# Patient Record
Sex: Male | Born: 1960 | Race: White | Hispanic: No | Marital: Married | Smoking: Current some day smoker
Health system: Southern US, Community
[De-identification: ages and names within clinical notes are randomized; demographics above are authoritative.]

## PROBLEM LIST (undated history)

## (undated) DIAGNOSIS — R918 Other nonspecific abnormal finding of lung field: Secondary | ICD-10-CM

## (undated) DIAGNOSIS — K661 Hemoperitoneum: Secondary | ICD-10-CM

## (undated) DIAGNOSIS — Z720 Tobacco use: Secondary | ICD-10-CM

## (undated) DIAGNOSIS — I10 Essential (primary) hypertension: Secondary | ICD-10-CM

## (undated) DIAGNOSIS — G8929 Other chronic pain: Secondary | ICD-10-CM

## (undated) DIAGNOSIS — I2510 Atherosclerotic heart disease of native coronary artery without angina pectoris: Secondary | ICD-10-CM

## (undated) DIAGNOSIS — I82409 Acute embolism and thrombosis of unspecified deep veins of unspecified lower extremity: Secondary | ICD-10-CM

## (undated) DIAGNOSIS — S36113A Laceration of liver, unspecified degree, initial encounter: Secondary | ICD-10-CM

## (undated) DIAGNOSIS — E291 Testicular hypofunction: Secondary | ICD-10-CM

## (undated) DIAGNOSIS — E785 Hyperlipidemia, unspecified: Secondary | ICD-10-CM

## (undated) DIAGNOSIS — S36039A Unspecified laceration of spleen, initial encounter: Secondary | ICD-10-CM

## (undated) DIAGNOSIS — I2699 Other pulmonary embolism without acute cor pulmonale: Secondary | ICD-10-CM

## (undated) DIAGNOSIS — S2239XA Fracture of one rib, unspecified side, initial encounter for closed fracture: Secondary | ICD-10-CM

## (undated) HISTORY — DX: Other pulmonary embolism without acute cor pulmonale: I26.99

## (undated) HISTORY — PX: BACK SURGERY: SHX140

## (undated) HISTORY — DX: Hyperlipidemia, unspecified: E78.5

## (undated) HISTORY — DX: Acute embolism and thrombosis of unspecified deep veins of unspecified lower extremity: I82.409

## (undated) HISTORY — PX: OTHER SURGICAL HISTORY: SHX169

## (undated) HISTORY — DX: Unspecified laceration of spleen, initial encounter: S36.039A

## (undated) HISTORY — DX: Essential (primary) hypertension: I10

## (undated) HISTORY — DX: Tobacco use: Z72.0

## (undated) HISTORY — DX: Laceration of liver, unspecified degree, initial encounter: S36.113A

## (undated) HISTORY — DX: Other nonspecific abnormal finding of lung field: R91.8

## (undated) HISTORY — DX: Testicular hypofunction: E29.1

## (undated) HISTORY — DX: Other chronic pain: G89.29

## (undated) HISTORY — PX: APPENDECTOMY: SHX54

## (undated) HISTORY — DX: Atherosclerotic heart disease of native coronary artery without angina pectoris: I25.10

## (undated) HISTORY — DX: Fracture of one rib, unspecified side, initial encounter for closed fracture: S22.39XA

## (undated) HISTORY — DX: Hemoperitoneum: K66.1

---

## 2001-05-03 ENCOUNTER — Emergency Department (HOSPITAL_COMMUNITY): Admission: EM | Admit: 2001-05-03 | Discharge: 2001-05-03 | Payer: Self-pay | Admitting: Emergency Medicine

## 2001-05-03 ENCOUNTER — Encounter: Payer: Self-pay | Admitting: Emergency Medicine

## 2001-11-22 ENCOUNTER — Encounter: Payer: Self-pay | Admitting: Family Medicine

## 2001-11-22 ENCOUNTER — Ambulatory Visit (HOSPITAL_COMMUNITY): Admission: RE | Admit: 2001-11-22 | Discharge: 2001-11-22 | Payer: Self-pay | Admitting: Family Medicine

## 2002-11-30 ENCOUNTER — Emergency Department (HOSPITAL_COMMUNITY): Admission: EM | Admit: 2002-11-30 | Discharge: 2002-12-01 | Payer: Self-pay | Admitting: *Deleted

## 2002-12-01 ENCOUNTER — Encounter: Payer: Self-pay | Admitting: *Deleted

## 2005-03-27 ENCOUNTER — Ambulatory Visit: Payer: Self-pay | Admitting: Occupational Therapy

## 2005-04-28 ENCOUNTER — Ambulatory Visit: Payer: Self-pay | Admitting: Internal Medicine

## 2007-04-08 DIAGNOSIS — I2510 Atherosclerotic heart disease of native coronary artery without angina pectoris: Secondary | ICD-10-CM

## 2007-04-08 HISTORY — PX: CARDIAC CATHETERIZATION: SHX172

## 2007-04-08 HISTORY — DX: Atherosclerotic heart disease of native coronary artery without angina pectoris: I25.10

## 2007-05-09 ENCOUNTER — Ambulatory Visit: Payer: Self-pay | Admitting: Cardiology

## 2007-05-09 ENCOUNTER — Inpatient Hospital Stay (HOSPITAL_COMMUNITY): Admission: EM | Admit: 2007-05-09 | Discharge: 2007-05-11 | Payer: Self-pay | Admitting: Emergency Medicine

## 2007-05-09 HISTORY — PX: CORONARY STENT PLACEMENT: SHX1402

## 2007-05-31 ENCOUNTER — Ambulatory Visit: Payer: Self-pay | Admitting: Cardiology

## 2007-10-27 ENCOUNTER — Ambulatory Visit: Payer: Self-pay

## 2007-10-27 ENCOUNTER — Ambulatory Visit: Payer: Self-pay | Admitting: Cardiology

## 2008-05-08 DIAGNOSIS — S36039A Unspecified laceration of spleen, initial encounter: Secondary | ICD-10-CM

## 2008-05-08 DIAGNOSIS — I2699 Other pulmonary embolism without acute cor pulmonale: Secondary | ICD-10-CM

## 2008-05-08 DIAGNOSIS — I82409 Acute embolism and thrombosis of unspecified deep veins of unspecified lower extremity: Secondary | ICD-10-CM

## 2008-05-08 DIAGNOSIS — S2249XA Multiple fractures of ribs, unspecified side, initial encounter for closed fracture: Secondary | ICD-10-CM

## 2008-05-08 DIAGNOSIS — S36113A Laceration of liver, unspecified degree, initial encounter: Secondary | ICD-10-CM

## 2008-05-08 HISTORY — DX: Multiple fractures of ribs, unspecified side, initial encounter for closed fracture: S22.49XA

## 2008-05-08 HISTORY — DX: Laceration of liver, unspecified degree, initial encounter: S36.113A

## 2008-05-08 HISTORY — DX: Unspecified laceration of spleen, initial encounter: S36.039A

## 2008-05-08 HISTORY — PX: THORACENTESIS: SHX235

## 2008-05-08 HISTORY — PX: FRACTURE SURGERY: SHX138

## 2008-05-08 HISTORY — DX: Other pulmonary embolism without acute cor pulmonale: I26.99

## 2008-05-08 HISTORY — DX: Acute embolism and thrombosis of unspecified deep veins of unspecified lower extremity: I82.409

## 2008-06-07 ENCOUNTER — Ambulatory Visit: Payer: Self-pay | Admitting: Physical Medicine & Rehabilitation

## 2008-06-07 ENCOUNTER — Inpatient Hospital Stay (HOSPITAL_COMMUNITY)
Admission: RE | Admit: 2008-06-07 | Discharge: 2008-06-15 | Payer: Self-pay | Admitting: Physical Medicine & Rehabilitation

## 2008-06-07 ENCOUNTER — Encounter: Payer: Self-pay | Admitting: Family Medicine

## 2008-06-22 ENCOUNTER — Ambulatory Visit: Payer: Self-pay | Admitting: Vascular Surgery

## 2008-06-22 ENCOUNTER — Inpatient Hospital Stay (HOSPITAL_COMMUNITY): Admission: EM | Admit: 2008-06-22 | Discharge: 2008-06-25 | Payer: Self-pay | Admitting: Emergency Medicine

## 2008-06-22 ENCOUNTER — Encounter (INDEPENDENT_AMBULATORY_CARE_PROVIDER_SITE_OTHER): Payer: Self-pay | Admitting: Emergency Medicine

## 2008-07-04 ENCOUNTER — Ambulatory Visit: Payer: Self-pay | Admitting: Family Medicine

## 2008-07-04 DIAGNOSIS — I82409 Acute embolism and thrombosis of unspecified deep veins of unspecified lower extremity: Secondary | ICD-10-CM | POA: Insufficient documentation

## 2008-07-04 DIAGNOSIS — I10 Essential (primary) hypertension: Secondary | ICD-10-CM

## 2008-07-04 DIAGNOSIS — Z86711 Personal history of pulmonary embolism: Secondary | ICD-10-CM

## 2008-07-04 DIAGNOSIS — G4700 Insomnia, unspecified: Secondary | ICD-10-CM

## 2008-07-09 ENCOUNTER — Ambulatory Visit: Payer: Self-pay | Admitting: Cardiovascular Disease

## 2008-07-09 LAB — CONVERTED CEMR LAB
INR: 1.5 — ABNORMAL HIGH (ref 0.8–1.0)
Prothrombin Time: 17.5 s — ABNORMAL HIGH (ref 10.9–13.3)

## 2008-07-11 ENCOUNTER — Telehealth: Payer: Self-pay | Admitting: Family Medicine

## 2008-07-12 ENCOUNTER — Ambulatory Visit: Payer: Self-pay | Admitting: Cardiovascular Disease

## 2008-07-12 LAB — CONVERTED CEMR LAB
INR: 1.7 — ABNORMAL HIGH (ref 0.8–1.0)
Prothrombin Time: 19.3 s — ABNORMAL HIGH (ref 10.9–13.3)

## 2008-07-16 ENCOUNTER — Encounter: Payer: Self-pay | Admitting: Family Medicine

## 2008-07-18 ENCOUNTER — Ambulatory Visit: Payer: Self-pay | Admitting: Family Medicine

## 2008-07-19 LAB — CONVERTED CEMR LAB
ALT: 27 units/L (ref 0–53)
AST: 20 units/L (ref 0–37)
Albumin: 4.1 g/dL (ref 3.5–5.2)
Alkaline Phosphatase: 79 units/L (ref 39–117)
BUN: 10 mg/dL (ref 6–23)
Bilirubin, Direct: 0.1 mg/dL (ref 0.0–0.3)
CO2: 31 meq/L (ref 19–32)
Calcium: 9.3 mg/dL (ref 8.4–10.5)
Chloride: 101 meq/L (ref 96–112)
Creatinine, Ser: 1.3 mg/dL (ref 0.4–1.5)
GFR calc Af Amer: 76 mL/min
GFR calc non Af Amer: 63 mL/min
Glucose, Bld: 81 mg/dL (ref 70–99)
Potassium: 4.2 meq/L (ref 3.5–5.1)
Sodium: 138 meq/L (ref 135–145)
Total Bilirubin: 0.5 mg/dL (ref 0.3–1.2)
Total Protein: 7.1 g/dL (ref 6.0–8.3)

## 2008-07-20 ENCOUNTER — Encounter: Payer: Self-pay | Admitting: Family Medicine

## 2008-07-24 ENCOUNTER — Telehealth: Payer: Self-pay | Admitting: Family Medicine

## 2008-07-24 ENCOUNTER — Encounter: Payer: Self-pay | Admitting: Family Medicine

## 2008-07-26 ENCOUNTER — Encounter: Payer: Self-pay | Admitting: Family Medicine

## 2008-08-03 ENCOUNTER — Ambulatory Visit: Payer: Self-pay | Admitting: Family Medicine

## 2008-08-03 DIAGNOSIS — M542 Cervicalgia: Secondary | ICD-10-CM | POA: Insufficient documentation

## 2008-08-07 ENCOUNTER — Encounter: Payer: Self-pay | Admitting: Family Medicine

## 2008-08-09 ENCOUNTER — Encounter: Payer: Self-pay | Admitting: Family Medicine

## 2008-08-15 ENCOUNTER — Telehealth: Payer: Self-pay | Admitting: Family Medicine

## 2008-08-16 ENCOUNTER — Encounter: Payer: Self-pay | Admitting: Family Medicine

## 2008-08-17 ENCOUNTER — Telehealth: Payer: Self-pay | Admitting: Family Medicine

## 2008-08-17 ENCOUNTER — Telehealth (INDEPENDENT_AMBULATORY_CARE_PROVIDER_SITE_OTHER): Payer: Self-pay | Admitting: *Deleted

## 2008-08-20 ENCOUNTER — Encounter: Payer: Self-pay | Admitting: Family Medicine

## 2008-08-22 ENCOUNTER — Encounter: Payer: Self-pay | Admitting: Family Medicine

## 2008-08-29 ENCOUNTER — Ambulatory Visit: Payer: Self-pay | Admitting: Family Medicine

## 2008-08-29 DIAGNOSIS — F17200 Nicotine dependence, unspecified, uncomplicated: Secondary | ICD-10-CM | POA: Insufficient documentation

## 2008-10-09 ENCOUNTER — Ambulatory Visit: Payer: Self-pay | Admitting: Family Medicine

## 2008-10-11 ENCOUNTER — Ambulatory Visit: Payer: Self-pay | Admitting: Cardiovascular Disease

## 2008-10-11 LAB — CONVERTED CEMR LAB
ALT: 22 units/L (ref 0–53)
AST: 15 units/L (ref 0–37)
Albumin: 4.4 g/dL (ref 3.5–5.2)
Alkaline Phosphatase: 65 units/L (ref 39–117)
Bilirubin, Direct: 0.1 mg/dL (ref 0.0–0.3)
Cholesterol: 216 mg/dL (ref 0–200)
Direct LDL: 125 mg/dL
HDL: 21.1 mg/dL — ABNORMAL LOW (ref >39.0)
INR: 2.1 — ABNORMAL HIGH (ref 0.8–1.0)
Prothrombin Time: 21.5 s — ABNORMAL HIGH (ref 10.9–13.3)
Total Bilirubin: 0.7 mg/dL (ref 0.3–1.2)
Total CHOL/HDL Ratio: 10.2
Total Protein: 7.4 g/dL (ref 6.0–8.3)
Triglycerides: 327 mg/dL (ref 0–149)
VLDL: 65 mg/dL — ABNORMAL HIGH (ref 0–40)

## 2008-10-16 ENCOUNTER — Telehealth: Payer: Self-pay | Admitting: Family Medicine

## 2008-10-22 ENCOUNTER — Encounter: Payer: Self-pay | Admitting: Family Medicine

## 2008-11-05 ENCOUNTER — Ambulatory Visit: Payer: Self-pay | Admitting: Cardiology

## 2008-11-26 ENCOUNTER — Telehealth: Payer: Self-pay | Admitting: Family Medicine

## 2008-11-28 ENCOUNTER — Ambulatory Visit: Payer: Self-pay | Admitting: Family Medicine

## 2008-12-03 ENCOUNTER — Ambulatory Visit: Payer: Self-pay | Admitting: Family Medicine

## 2008-12-03 DIAGNOSIS — E785 Hyperlipidemia, unspecified: Secondary | ICD-10-CM

## 2008-12-05 ENCOUNTER — Telehealth: Payer: Self-pay | Admitting: Family Medicine

## 2008-12-13 ENCOUNTER — Ambulatory Visit: Payer: Self-pay | Admitting: Internal Medicine

## 2008-12-19 ENCOUNTER — Telehealth: Payer: Self-pay | Admitting: Family Medicine

## 2008-12-26 ENCOUNTER — Encounter: Payer: Self-pay | Admitting: Cardiovascular Disease

## 2008-12-26 ENCOUNTER — Ambulatory Visit: Payer: Self-pay | Admitting: Cardiovascular Disease

## 2008-12-27 ENCOUNTER — Ambulatory Visit: Payer: Self-pay

## 2009-01-01 ENCOUNTER — Ambulatory Visit: Payer: Self-pay | Admitting: Cardiology

## 2009-01-05 DIAGNOSIS — K683 Retroperitoneal hematoma: Secondary | ICD-10-CM

## 2009-01-05 DIAGNOSIS — K661 Hemoperitoneum: Secondary | ICD-10-CM

## 2009-01-05 HISTORY — PX: CERVICAL SPINE SURGERY: SHX589

## 2009-01-05 HISTORY — DX: Hemoperitoneum: K66.1

## 2009-01-05 HISTORY — DX: Retroperitoneal hematoma: K68.3

## 2009-01-08 ENCOUNTER — Ambulatory Visit: Payer: Self-pay | Admitting: Internal Medicine

## 2009-01-08 ENCOUNTER — Ambulatory Visit: Payer: Self-pay | Admitting: Cardiovascular Disease

## 2009-01-10 ENCOUNTER — Telehealth: Payer: Self-pay | Admitting: Cardiovascular Disease

## 2009-01-17 ENCOUNTER — Telehealth: Payer: Self-pay | Admitting: Family Medicine

## 2009-01-21 ENCOUNTER — Encounter: Payer: Self-pay | Admitting: Family Medicine

## 2009-01-21 ENCOUNTER — Emergency Department (HOSPITAL_COMMUNITY): Admission: EM | Admit: 2009-01-21 | Discharge: 2009-01-21 | Payer: Self-pay | Admitting: Emergency Medicine

## 2009-01-22 ENCOUNTER — Encounter: Payer: Self-pay | Admitting: Family Medicine

## 2009-01-24 ENCOUNTER — Encounter: Payer: Self-pay | Admitting: Family Medicine

## 2009-01-25 ENCOUNTER — Telehealth (INDEPENDENT_AMBULATORY_CARE_PROVIDER_SITE_OTHER): Payer: Self-pay | Admitting: Cardiology

## 2009-01-25 ENCOUNTER — Telehealth: Payer: Self-pay | Admitting: Family Medicine

## 2009-01-28 ENCOUNTER — Ambulatory Visit: Payer: Self-pay | Admitting: Family Medicine

## 2009-01-29 LAB — CONVERTED CEMR LAB
Basophils Absolute: 0 10*3/uL (ref 0.0–0.1)
Eosinophils Relative: 2.6 % (ref 0.0–5.0)
HCT: 37.5 % — ABNORMAL LOW (ref 39.0–52.0)
Lymphs Abs: 3.4 10*3/uL (ref 0.7–4.0)
MCV: 95.6 fL (ref 78.0–100.0)
Monocytes Absolute: 0.2 10*3/uL (ref 0.1–1.0)
Platelets: 605 10*3/uL — ABNORMAL HIGH (ref 150.0–400.0)
Prothrombin Time: 12.1 s (ref 10.9–13.3)
RDW: 11.4 % — ABNORMAL LOW (ref 11.5–14.6)

## 2009-02-01 ENCOUNTER — Encounter: Payer: Self-pay | Admitting: Family Medicine

## 2009-02-01 ENCOUNTER — Telehealth: Payer: Self-pay | Admitting: Family Medicine

## 2009-02-05 ENCOUNTER — Encounter: Payer: Self-pay | Admitting: *Deleted

## 2009-02-12 ENCOUNTER — Ambulatory Visit: Payer: Self-pay | Admitting: Family Medicine

## 2009-02-20 LAB — CONVERTED CEMR LAB
Anticardiolipin IgG: <7 (ref <11)
Protein S Ag, Total: 109 % (ref 70–140)

## 2009-02-28 ENCOUNTER — Encounter: Payer: Self-pay | Admitting: Cardiovascular Disease

## 2009-02-28 ENCOUNTER — Ambulatory Visit: Payer: Self-pay

## 2009-02-28 ENCOUNTER — Telehealth: Payer: Self-pay | Admitting: Cardiovascular Disease

## 2009-03-13 ENCOUNTER — Encounter: Payer: Self-pay | Admitting: *Deleted

## 2009-04-16 ENCOUNTER — Ambulatory Visit: Payer: Self-pay | Admitting: Family Medicine

## 2009-04-17 LAB — CONVERTED CEMR LAB
ALT: 24 units/L (ref 0–53)
AST: 19 units/L (ref 0–37)
Alkaline Phosphatase: 61 units/L (ref 39–117)
Bilirubin, Direct: 0 mg/dL (ref 0.0–0.3)
Eosinophils Relative: 1.7 % (ref 0.0–5.0)
HCT: 45.8 % (ref 39.0–52.0)
Lymphs Abs: 2.9 10*3/uL (ref 0.7–4.0)
Monocytes Relative: 6.4 % (ref 3.0–12.0)
Platelets: 379 10*3/uL (ref 150.0–400.0)
Total Bilirubin: 1 mg/dL (ref 0.3–1.2)
WBC: 8.8 10*3/uL (ref 4.5–10.5)

## 2009-04-18 ENCOUNTER — Encounter: Payer: Self-pay | Admitting: Cardiology

## 2009-05-22 ENCOUNTER — Ambulatory Visit: Payer: Self-pay | Admitting: Family Medicine

## 2009-05-22 DIAGNOSIS — R413 Other amnesia: Secondary | ICD-10-CM

## 2009-05-22 DIAGNOSIS — F411 Generalized anxiety disorder: Secondary | ICD-10-CM

## 2009-05-22 LAB — CONVERTED CEMR LAB: Creatinine, Ser: 0.9 mg/dL (ref 0.4–1.5)

## 2009-05-27 ENCOUNTER — Telehealth (INDEPENDENT_AMBULATORY_CARE_PROVIDER_SITE_OTHER): Payer: Self-pay | Admitting: *Deleted

## 2009-06-10 ENCOUNTER — Telehealth: Payer: Self-pay | Admitting: Family Medicine

## 2009-06-11 ENCOUNTER — Telehealth (INDEPENDENT_AMBULATORY_CARE_PROVIDER_SITE_OTHER): Payer: Self-pay | Admitting: *Deleted

## 2009-06-24 ENCOUNTER — Ambulatory Visit: Payer: Self-pay | Admitting: Family Medicine

## 2009-07-02 ENCOUNTER — Telehealth: Payer: Self-pay | Admitting: Family Medicine

## 2009-07-04 ENCOUNTER — Ambulatory Visit: Payer: Self-pay | Admitting: Family Medicine

## 2009-07-05 ENCOUNTER — Telehealth: Payer: Self-pay | Admitting: Family Medicine

## 2009-08-20 ENCOUNTER — Telehealth (INDEPENDENT_AMBULATORY_CARE_PROVIDER_SITE_OTHER): Payer: Self-pay | Admitting: *Deleted

## 2009-10-15 ENCOUNTER — Encounter (INDEPENDENT_AMBULATORY_CARE_PROVIDER_SITE_OTHER): Payer: Self-pay | Admitting: *Deleted

## 2009-11-14 ENCOUNTER — Encounter: Payer: Self-pay | Admitting: Family Medicine

## 2009-11-15 ENCOUNTER — Ambulatory Visit: Payer: Self-pay | Admitting: Family Medicine

## 2009-11-15 DIAGNOSIS — R5383 Other fatigue: Secondary | ICD-10-CM

## 2009-11-15 DIAGNOSIS — N529 Male erectile dysfunction, unspecified: Secondary | ICD-10-CM

## 2009-11-15 DIAGNOSIS — M1612 Unilateral primary osteoarthritis, left hip: Secondary | ICD-10-CM | POA: Insufficient documentation

## 2009-11-15 DIAGNOSIS — R5381 Other malaise: Secondary | ICD-10-CM | POA: Insufficient documentation

## 2009-11-18 LAB — CONVERTED CEMR LAB
Direct LDL: 124.9 mg/dL
Testosterone: 157.98 ng/dL — ABNORMAL LOW (ref 350.00–890.00)

## 2009-11-22 ENCOUNTER — Telehealth: Payer: Self-pay | Admitting: Family Medicine

## 2009-11-26 ENCOUNTER — Telehealth (INDEPENDENT_AMBULATORY_CARE_PROVIDER_SITE_OTHER): Payer: Self-pay | Admitting: *Deleted

## 2009-12-19 ENCOUNTER — Telehealth: Payer: Self-pay | Admitting: Family Medicine

## 2009-12-19 ENCOUNTER — Telehealth (INDEPENDENT_AMBULATORY_CARE_PROVIDER_SITE_OTHER): Payer: Self-pay | Admitting: *Deleted

## 2009-12-19 ENCOUNTER — Ambulatory Visit: Payer: Self-pay | Admitting: Family Medicine

## 2009-12-19 DIAGNOSIS — F1910 Other psychoactive substance abuse, uncomplicated: Secondary | ICD-10-CM | POA: Insufficient documentation

## 2009-12-20 ENCOUNTER — Telehealth: Payer: Self-pay | Admitting: Family Medicine

## 2010-01-03 ENCOUNTER — Telehealth: Payer: Self-pay | Admitting: Cardiovascular Disease

## 2010-01-03 ENCOUNTER — Telehealth: Payer: Self-pay | Admitting: Family Medicine

## 2010-01-03 ENCOUNTER — Emergency Department (HOSPITAL_COMMUNITY): Admission: EM | Admit: 2010-01-03 | Discharge: 2010-01-04 | Payer: Self-pay | Admitting: Emergency Medicine

## 2010-01-13 ENCOUNTER — Emergency Department: Payer: Self-pay | Admitting: Emergency Medicine

## 2010-02-07 ENCOUNTER — Telehealth: Payer: Self-pay | Admitting: Cardiovascular Disease

## 2010-03-04 ENCOUNTER — Ambulatory Visit: Payer: Self-pay | Admitting: Cardiovascular Disease

## 2010-04-25 ENCOUNTER — Telehealth (INDEPENDENT_AMBULATORY_CARE_PROVIDER_SITE_OTHER): Payer: Self-pay | Admitting: *Deleted

## 2010-05-15 ENCOUNTER — Telehealth (INDEPENDENT_AMBULATORY_CARE_PROVIDER_SITE_OTHER): Payer: Self-pay | Admitting: *Deleted

## 2010-05-29 ENCOUNTER — Telehealth (INDEPENDENT_AMBULATORY_CARE_PROVIDER_SITE_OTHER): Payer: Self-pay | Admitting: *Deleted

## 2010-07-08 ENCOUNTER — Telehealth: Payer: Self-pay | Admitting: Cardiovascular Disease

## 2010-07-11 ENCOUNTER — Encounter: Payer: Self-pay | Admitting: Cardiovascular Disease

## 2010-07-11 ENCOUNTER — Ambulatory Visit: Payer: Self-pay | Admitting: Cardiovascular Disease

## 2010-07-22 ENCOUNTER — Ambulatory Visit: Payer: Self-pay | Admitting: Cardiovascular Disease

## 2010-07-28 ENCOUNTER — Telehealth: Payer: Self-pay | Admitting: Cardiovascular Disease

## 2010-09-28 ENCOUNTER — Encounter: Payer: Self-pay | Admitting: Family Medicine

## 2010-09-30 ENCOUNTER — Encounter: Payer: Self-pay | Admitting: Family Medicine
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
## 2010-10-09 NOTE — Progress Notes (Signed)
Summary: pt needs refill  Phone Note Refill Request Call back at Home Phone [phone] Message from:  Patient on [phone]/[facility] in [place]  Refills Requested: Medication #1:  METOPROLOL TARTRATE 25 MG TABS 1 by mouth two times a day Initial call taken by: Omer Jack,  February 07, 2010 12:56 PM  Follow-up for Phone Call        Rx faxed to pharmacy Follow-up by: [name],  February 10, 2010 10:10 AM    Prescriptions: METOPROLOL TARTRATE 25 MG TABS (METOPROLOL TARTRATE) 1 by mouth two times a day  #60 x 2   Entered by:   [name]   Authorized by:   Norva Karvonen, MD   Signed by:   [name] on 02/10/2010   Method used:   Faxed to ...       [facility] (retail)       [address]       [place], [place]  [address]       Ph: [phone]       Fax: [phone]   RxID:   [id number]

## 2010-10-09 NOTE — Progress Notes (Signed)
Summary: androgel  Phone Note Call from Patient Call back at Home Phone [phone]   Caller: Patient Call For: Hannah Beat MD Summary of Call: Patient called to let you know that the number to call for the PA for the androgel. [phone] Initial call taken by: Melody Comas,  November 26, 2009 4:52 PM  Follow-up for Phone Call        please assist Follow-up by: Hannah Beat MD,  November 27, 2009 8:05 AM  Additional Follow-up for Phone Call Additional follow up Details #1::        Advised pt that he wil need to get his pharmacy to fax [place] a prior auth form so that we will have the information we need to call his insurance company.  Pt agreed to call. Additional Follow-up by: Lowella Petties CMA,  November 27, 2009 9:18 AM

## 2010-10-09 NOTE — Progress Notes (Signed)
  request recieved from [facility] sent to [facility]  May 29, 2010 9:10 AM     Appended Document:  Recieved request back from [facility] sent to [facility]
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
## 2010-10-09 NOTE — Progress Notes (Signed)
Summary: wife is requesting that you call her  Phone Note Call from Patient Call back at [phone]   Caller: Spouse Angie Summary of Call: Pt's wife is asking that you call her so that she can explain some things to you.  She is asking for advise on how to get pt some help.   Initial call taken by: Lowella Petties CMA,  January 03, 2010 8:22 AM  Follow-up for Phone Call        20 minute conversation, patient is safe in the [facility] ER right now. Discussed his drug problems with his wife yesterday, and now they are seeking help there. Discussed that the behavioral health center was most reasonable for evaluation and continuing care. Additionally, suggested that [facility] is one of the best facilities in the country, fairly near where they live. Additionally, recommended narcotics anonymous and [place] for her.  Follow-up by: Hannah Beat MD,  January 03, 2010 12:19 PM

## 2010-10-09 NOTE — Letter (Signed)
Summary: [place] No Show Letter  [place] at [facility]  [address] [place], [place] [address]   Phone: [phone]  Fax: [phone]    10/15/2009 MRN: [id number]  Farmer [place] [address] [place], [place]  [address]   Dear Mr. BARLEY,   Our records indicate that you missed your scheduled appointment with __lab___________________ on ___2.8.11_________.  Please contact this office to reschedule your appointment as soon as possible.  It is important that you keep your scheduled appointments with your physician, so we can provide you the best care possible.  Please be advised that there may be a charge for "no show" appointments.    Sincerely,   [place] at [facility]
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
## 2010-10-09 NOTE — Progress Notes (Signed)
Summary: medication questions  Phone Note Call from Patient Call back at Home Phone [phone] Call back at [phone]   Caller: Patient/Janet Reason for Call: Talk to Nurse, Talk to Doctor Summary of Call: pt's neuro psychologist wants to put pt on ritalin 50mg  and needs an EKG to help with patients attention span and they want to discuss this Initial call taken by: Omer Jack,  July 08, 2010 1:56 PM  Follow-up for Phone Call        Will attempt to reach pt on 07/09/10.  Voicemail does not have correct name. Julieta Gutting, RN, BSN  July 08, 2010 6:31 PM  pt's wife returning call -pls call [phone] or [phone] Glynda Jaeger  July 09, 2010 10:33 AM  I spoke with the pt's wife and she said the pt's neurologist started him on Ritalin about a month ago.  The pt was suppose to follow-up with Dr Excell Seltzer to make sure this was okay from a cardiac standpoint. The neurologist would like the pt to have an EKG.  I arranged an appt for the pt to see Dr Excell Seltzer on 07/11/10.    Follow-up by: Julieta Gutting, RN, BSN,  July 09, 2010 11:20 AM
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
## 2010-10-09 NOTE — Progress Notes (Signed)
Summary: regarding visit  Phone Note Call from Patient   Caller: Patient Call For: Hannah Beat MD Summary of Call: Pt called and said he would find himself another doctor, that's all he would tell me. Initial call taken by: Lowella Petties CMA,  December 19, 2009 3:02 PM  Follow-up for Phone Call        Noted, and see all notes involved with this case. Follow-up by: Hannah Beat MD,  December 19, 2009 5:02 PM

## 2010-10-09 NOTE — Progress Notes (Signed)
  Phone Note Call from Patient Call back at [facility] Phone [phone]   Caller: Patient Call For: Dr.Latrell Reitan Summary of Call: Pt. called to say he wanted to get his records because he is looking for a new doctor.  He said he appreciated your help,but he was going to find help elsewhere.  He'll be coming by next week to sign a release for his records. Initial call taken by: Beau Fanny,  December 20, 2009 2:34 PM  Follow-up for Phone Call        noted. Follow-up by: Hannah Beat MD,  December 20, 2009 4:07 PM

## 2010-10-09 NOTE — Progress Notes (Signed)
Summary: MD in [facility] is waiting on note / ekg  Phone Note Call from Patient Call back at [facility] Phone [phone]   Caller: Spouse Reason for Call: Talk to Nurse Summary of Call: MD in [place] is waiting on office note & ekg . fax # [phone]. attention Dr. Bartholomew Boards Initial call taken by: Lorne Skeens,  July 28, 2010 10:22 AM  Follow-up for Phone Call        I left a message that records are being faxed. Julieta Gutting, RN, BSN  July 28, 2010 10:35 AM
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
## 2010-10-09 NOTE — Progress Notes (Signed)
Summary: PT REQUESTING CALL DUE THE PT MEDICATIONS  Phone Note Call from Patient Call back at [phone]   Caller: Spouse/ ANGIE Summary of Call: PT WIFE REQUESTING CALL DUE TO MEDICATIONS Initial call taken by: Judie Grieve,  January 03, 2010 9:47 AM  Follow-up for Phone Call        Spoke with pt's wife who is crying on phone and states husband asked her to get help for him today for his addiction to pain medications.  Wife states pt is anxious and not sleeping. No complaints of chest pain. I asked wife to contact primary MD for this but she states he no longer has primary MD. Wife states she was unaware of Dr. Durel Salts offer to provide referrals at last office visit with him. I instructed wife the best course of action would be to take pt to emergency room to be seen by emergency room doctor for evaluation. I told wife that at this time pt would not be seen by Dr. Excell Seltzer in ED as he was not having cardiac issues and would be evaluated by ED physician.  Wife agrees with this plan and states husband is willing to go to ED. Follow-up by: Dossie Arbour, RN, BSN,  January 03, 2010 10:16 AM
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
## 2010-10-09 NOTE — Letter (Signed)
Summary: Discharge Letter  [place] [facility]  [address] [place], [place] [address]   Phone: [phone]  Fax: [phone]       12/19/2009 MRN: [id number]  [name] [address] [place], [place]  [address]  Dear [name],   I find it necessary to inform you that I will not be able to provide medical care to you, because of behavior at the office visit and interactions with our staff on 12/19/2009.  Since your condition requires medical attention, I suggest that you place your self under the care of another physician without delay. If you desire, I will be available for emergency care for 30 days after you receive this letter.  This should give you ample time to select a physician of your choice from the many competent providers in this area. You may want to call the local medical society or [facility]'s physician referral service [phone]) for their assistance in locating a new physician. With your written authorization, I will make a copy of your medical record available to your new physician.   Sincerely,    Dr. Karleen Hampshire Cele Mote    Appended Document: Discharge Letter IDX and EMR updated to reflect dismissal. Letter sent by certified mail.  Appended Document: Discharge Letter Signed receipt returned from [employer] verifying delivery.

## 2010-10-09 NOTE — Progress Notes (Signed)
Summary: Not acting right  Phone Note Call from Patient Call back at [phone]   Caller: Spouse/Angie Call For: Oscar Beat MD Summary of Call: Patient's wife called and says that her husband is so out of sorts.  Thinks his anxiety medication is not working.  Fell yesterday morning and hit his head, she wants him to been seen earlier than his 2:00 appt today.  Advised her that there were no open appt times for earlier.  Please advise. Initial call taken by: Linde Gillis CMA Duncan Dull),  December 19, 2009 8:10 AM  Follow-up for Phone Call        Currently I have 1 patient is acutely ill and decompensating in the office and am 4 patients behind.  I would suggest ER evaluation if hit head and out of sorts most appropriate.  Follow-up by: Oscar Beat MD,  December 19, 2009 8:25 AM  Additional Follow-up for Phone Call Additional follow up Details #1::        Advised patient's wife as instructed.  She says that she is not taking him to the ER for evaluation, he seems to be doing a little better.  Will just wait and bring him in his afternoon at his 2:00 appt. Additional Follow-up by: Linde Gillis CMA Duncan Dull),  December 19, 2009 8:35 AM    Additional Follow-up for Phone Call Additional follow up Details #2::    noted Follow-up by: Oscar Beat MD,  December 19, 2009 9:10 AM

## 2010-10-09 NOTE — Progress Notes (Signed)
Summary: wife requests phone call   Phone Note Call from Patient Call back at [phone]   Caller: Spouse- Angie Summary of Call: Pt was prescribed androgel for low testosterone.  Wife is concerned because a possible side effect is blood clots, and pt has a history of these.  He has not started this yet.  Please advise.  Also, I advised her that pt's brain MRI was essentially normal, she would like to discuss this with you.  Please call if you can.  She knows you are out till monday. Initial call taken by: Lowella Petties CMA,  November 22, 2009 8:48 AM  Follow-up for Phone Call        Attempted call, but no answer.  Hypercoaguable work-up negative. Discussed all findings with Kairee previously for about 10 mins on phone -- willl attempt to call again and discuss with wife if he is agreeable to that discussion. Follow-up by: Hannah Beat MD,  November 23, 2009 3:36 PM  Additional Follow-up for Phone Call Additional follow up Details #1::        Unable to contact patient to get permission to speak to wife, spoke in generalities about low testosterone to wife. More specific patient questions raised, and will attempt to get patient persmission to discuss with her further. Additional Follow-up by: Hannah Beat MD,  November 24, 2009 10:11 AM    Additional Follow-up for Phone Call Additional follow up Details #2::    I tried to explain HPA problems to the best of my ability. Hypogonadism - 20 minute conversation and answered all questions.  Fatigue, depression, erection problems, Test level of 150. Follow-up by: Hannah Beat MD,  November 25, 2009 5:30 PM
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
## 2010-10-23 NOTE — Letter (Signed)
Summary: [facility]  [facility]   Imported By: Kassie Mends 10/15/2010 10:04:16  _____________________________________________________________________  External Attachment:    Type:   Image     Comment:   External Document

## 2010-11-25 LAB — RAPID URINE DRUG SCREEN, HOSP PERFORMED
Amphetamines: NOT DETECTED
Benzodiazepines: POSITIVE — AB
Cocaine: NOT DETECTED
Tetrahydrocannabinol: NOT DETECTED

## 2010-11-25 LAB — HEPATIC FUNCTION PANEL
Albumin: 4.5 g/dL (ref 3.5–5.2)
Bilirubin, Direct: 0.1 mg/dL (ref 0.0–0.3)
Total Bilirubin: 0.7 mg/dL (ref 0.3–1.2)

## 2010-11-25 LAB — POCT I-STAT, CHEM 8
BUN: 17 mg/dL (ref 6–23)
Calcium, Ion: 1.09 mmol/L — ABNORMAL LOW (ref 1.12–1.32)
Chloride: 107 meq/L (ref 96–112)
Creatinine, Ser: 0.6 mg/dL (ref 0.4–1.5)
Glucose, Bld: 128 mg/dL — ABNORMAL HIGH (ref 70–99)
HCT: 50 % (ref 39.0–52.0)
Hemoglobin: 17 g/dL (ref 13.0–17.0)
Potassium: 4 mEq/L (ref 3.5–5.1)
Sodium: 141 mEq/L (ref 135–145)
TCO2: 26 mmol/L (ref 0–100)

## 2010-11-25 LAB — TRICYCLICS SCREEN, URINE: TCA Scrn: NOT DETECTED

## 2010-11-25 LAB — CBC
Hemoglobin: 16.2 g/dL (ref 13.0–17.0)
MCHC: 34.8 g/dL (ref 30.0–36.0)
RDW: 13.2 % (ref 11.5–15.5)

## 2010-11-25 LAB — DIFFERENTIAL
Basophils Absolute: 0 10*3/uL (ref 0.0–0.1)
Basophils Relative: 0 % (ref 0–1)
Monocytes Absolute: 0.7 10*3/uL (ref 0.1–1.0)
Neutro Abs: 10.4 10*3/uL — ABNORMAL HIGH (ref 1.7–7.7)
Neutrophils Relative %: 77 % (ref 43–77)
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
## 2011-01-20 NOTE — Assessment & Plan Note (Signed)
[facility]                            CARDIOLOGY OFFICE NOTE   NAME:[name], [name]                       MRN:          [id number]  DATE:10/27/2007                            DOB:          1961/07/18    PRIMARY CARE:  [facility].   REASON FOR VISIT:  Routine follow-up.   HISTORY OF PRESENT ILLNESS:  Mr. Cratty was seen back in September.  He  had a history of coronary disease having presented with symptoms  consistent with unstable angina last year.  He underwent a diagnostic  cardiac catheterization in August demonstrating a focal 90% stenosis  within a very large right coronary artery and underwent bare metal stent  placement by Dr. Excell Seltzer.  He is otherwise being treated medically for  small branch vessel disease of an obtuse marginal.  He is not reporting  any significant angina.  Electrocardiogram shows no marked changes.  He  states that he has continued to have intermittent pain in his right  groin site around the area of catheterization.  He states that this  wakes him up at night, particularly when he is on his right side, and  when he turns over, symptoms get better.  He also has this when he walks  sometimes.  Otherwise, no complaints.   ALLERGIES:  NO KNOWN DRUG ALLERGIES.   MEDICATIONS:  1. Enteric-coated aspirin 325 mg p.o. daily.  2. Plavix 75 mg p.o. daily.  3. Lipitor 30 mg p.o. q.h.s.  4. Toprol XL 20 mg p.o. daily.  5. Omega-3 supplements.  6. Multivitamin daily.   REVIEW OF SYSTEMS:  As described in history of present illness,  otherwise negative.   PHYSICAL EXAMINATION:  VITAL SIGNS:  Blood pressure is 131/82, heart  rate is 70, weights 269 pounds, an overweight male in no acute distress.  HEENT:  Conjunctivae was normal.  Pharynx clear.  NECK:  Supple.  No elevated venous pressure no loud bruits.  No  thyromegaly.  LUNGS:  Clear labored breathing.  CARDIAC:  Exam was regular rate and rhythm, no murmurs or  gallop.  ABDOMEN:  Soft, nontender.  Normoactive bowel sounds.  EXTREMITIES:  Examination the right groin site reveals no obvious bruit.  No hematoma.  No pulsatile mass.  Distal pulses are 2+.  SKIN:  Warm and dry.  MUSCULOSKELETAL:  Kyphosis noted.  NEUROPSYCHIATRIC:  The patient alert and oriented x3.  Affect is  appropriate.   IMPRESSION:  1. Right groin pain status post catheterization last August.  No      obvious findings of bruit or pulsatile mass in this area.  Distal      pulses were intact.  This may be neuropathic pain.  We discussed      proceeding with an ultrasound to exclude AV fistula or      pseudoaneurysm.  If these entities are not found and his symptoms      persist, we could consider a trial of Neurontin for neuropathic      pain.  We will discuss results with him by phone.  2. Coronary artery disease  status post previous bare metal stent      placement to the right coronary artery.  The patient is      symptomatically stable.  His electrocardiogram is stable.  Will      plan to continue medical therapy with a 41-month follow-up.     Jonelle Sidle, MD  Electronically Signed    SGM/MedQ  DD: 10/27/2007  DT: 10/28/2007  Job #: [id number]

## 2011-01-20 NOTE — Assessment & Plan Note (Signed)
[facility]                            CARDIOLOGY OFFICE NOTE   NAME:[name], [name]                       MRN:          [id number]  DATE:05/31/2007                            DOB:          1961/07/25    PRIMARY CARE PHYSICIAN:  [facility].   REASON FOR VISIT:  Followup hospital stay.   HISTORY OF PRESENT ILLNESS:  [name] is a 50 year old male with a  history of hyperlipidemia, tobacco use, and recently diagnosed coronary  artery disease.  He presented in late August with symptoms concerning  for unstable angina and ruled out for myocardial infarction.  He  underwent a diagnostic cardiac catheterization on August 2, which  demonstrated coronary artery disease including a focal 90% stenosis  within a very large right coronary artery with otherwise predominantly  mild coronary atherosclerosis.  He did have an 80% stenosis involving a  small obtuse marginal branch.  He underwent coronary intervention with  placement of a bare metal stent by Dr. Excell Seltzer to address the right  coronary artery stenosis and tolerated this well.  Otherwise, medical  therapy and risk factor modification was recommended.   The patient was discharged home on September 3.  He reports doing well  at this point.  He has stopped smoking and using Chantix.  He is taking  his medicines as directed.  He did decrease his Toprol XL from 50 to 25  mg a day with some concerns about fatigue and depression, although these  symptoms have improved.  He has had no reported erectile dysfunction.  His electrocardiogram today shows sinus rhythm at 75 beats per minute  with some ectopic complexes.  He is tolerating Lipitor at this time and  also is taking Omega-3 supplements.   PRESENT MEDICATIONS:  1. Aspirin 325 mg p.o. daily.  2. Plavix 75 mg p.o. daily.  3. Lipitor 20 mg p.o. q.h.s.  4. Toprol XL 25 mg p.o. daily.  5. Sublingual nitroglycerin 0.4 mg p.r.n.  6. Omega-3  supplements.  7. Chantix.  8. He is also taking multivitamins.   REVIEW OF SYSTEMS:  As in history of present illness.  No bleeding  problems.  He did have some progressive tracking of ecchymosis in his  right groin sites as this catheterization, although this has healed  nicely.  No pain or swelling in this area.   EXAMINATION:  Blood pressure is 130/78; heart rate is 80 and regular.  The patient is comfortable and in no acute distress.  NECK:  No elevated jugular venous pressure, no bruits.  No thyromegaly  is noted.  LUNGS:  Clear without labored breathing at rest.  CARDIAC:  Regular rate and rhythm.  No loud murmur or gallop.  RIGHT GROIN SITE:  Stable.  Very small area of residual ecchymosis in  the mid thigh area.  No bruit or tenderness.  EXTREMITIES:  No pitting edema, otherwise.   IMPRESSION:  1. Coronary artery disease status post presentation with unstable      angina and now status post placement of a bare metal stent to the  right coronary artery as outlined above.  Ejection fraction is      normal and otherwise the patient predominantly has mild      atherosclerosis within the major epicardial vessels.  I would      anticipate he should do well, assuming he remains compliant with      aggressive risk factor modification, smoking cessation.  I have      recommended the basic exercise regimen, at this point walking, and      we will plan to see him back over the next 6 months.  Otherwise, he      can continue with his primary care Taniesha Glanz as needed.  2. We will plan followup lipids and liver function tests at his next      visit.  His predominant prior abnormality was hypertriglyceridemia.      Hopefully, this will improve with the combination of diet, Lipitor,      and Omega-3 supplements.  At that point, we can get a better      reading on his LDL management.     Jonelle Sidle, MD  Electronically Signed    SGM/MedQ  DD: 05/31/2007  DT: 05/31/2007  Job  #: [id number]

## 2011-01-20 NOTE — Cardiovascular Report (Signed)
[name], [name]                ACCOUNT NO.:  [id number]   MEDICAL RECORD NO.:  [id number]          PATIENT TYPE:  INP   LOCATION:  3728                         FACILITY:  [facility]   PHYSICIAN:  Veverly Fells. Excell Seltzer, MD  DATE OF BIRTH:  August 04, 1961   DATE OF PROCEDURE:  05/10/2007  DATE OF DISCHARGE:                            CARDIAC CATHETERIZATION   PROCEDURE:  1. Percutaneous transluminal coronary angioplasty and stenting of the      right coronary artery.  2. StarClose of the right femoral artery.   INDICATIONS:  [name] is a 50 year old gentleman who was admitted  with crescendo angina.  He does not have documented cardiac disease, but  his symptoms were classic for angina.  A underwent diagnostic  catheterization by Dr. Diona Browner.  He has a severe 90% eccentric stenosis  of the mid-right coronary artery, and nonobstructive disease elsewhere.  We elected to intervene on the right coronary artery.  The patient was  consented to the Perseus Study, but we elected not to enroll him as his  right coronary artery was very large; and we thought would be better  treated with a bare metal stent.   Risks and indications of the procedure were reviewed with the patient.  Angiomax was used for anticoagulation.  Once a therapeutic ACT was  achieved, a 6-French JR-4 guide catheter was inserted.  The lesion was  easily wired with a cougar wire which was passed into the distal right  coronary artery.  The focal lesion in the mid-right coronary artery was  predilated with a 3-0 x 12 Maverick balloon up to 10 atmospheres.  Following predilatation a 40 x 15-mm Vision stent was deployed at 12  atmospheres.  Following stenting, the stent appeared well expanded and  there was TIMI 3 flow throughout the vessel.  There was a minor waste in  the midportion; and the stent appeared a little bit undersized.  I  elected to post dilate it with a 4.5 x 8 mm Quantum Maverick up to 20  atmospheres on multiple  inflations.   Following postdilatation there was an excellent angiographic result with  a widely expanded stent TIMI 3 flow throughout the vessel.  The patient  tolerated the procedure well; and had no immediate complications.  The  femoral sheath was removed and the StarClose device used to seal the  femoral arteriotomy.   ASSESSMENT:  Successful percutaneous coronary intervention of the right  coronary artery with a bare metal stent.   RECOMMEND:  Aspirin indefinitely and clopidogrel 75 mg daily for 30  days.      Veverly Fells. Excell Seltzer, MD  Electronically Signed     MDC/MEDQ  D:  05/10/2007  T:  05/10/2007  Job:  [id number]

## 2011-01-20 NOTE — Assessment & Plan Note (Signed)
[facility]                                 ON-CALL NOTE   NAME:Cogan, Abdifatah                         MRN:          [id number]  DATE:01/21/2009                            DOB:          1961/08/10    Phone number is [phone].   Dr. Karleen Hampshire Copland is the regular doctor.   CHIEF COMPLAINT:  Possible clot in leg.   Ms. Dorner called, says she has been in [facility] waiting for Mr.  Bily to be seen for quite some time.  She said that she thinks he has  a clot in his leg and because he has not been seen in a timely fashion,  she has called 911, they are coming with an ambulance to transport him  to [facility] for emergent care.  She said he has had DVTs in the past and  wanted to let us know that she had called an ambulance for transport.  I  advised her I would let Dr. Patsy Lager know.     Marne A. Tower, MD  Electronically Signed    MAT/MedQ  DD: 01/21/2009  DT: 01/22/2009  Job #: [id number]
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
## 2011-01-20 NOTE — Discharge Summary (Signed)
NAMEIVIS, HENNEMAN NO.:  [id number]   MEDICAL RECORD NO.:  [id number]          PATIENT TYPE:  INP   LOCATION:  2034                         FACILITY:  [facility]   PHYSICIAN:  Jonelle Sidle, MD DATE OF BIRTH:  [birth year]   DATE OF ADMISSION:  05/08/2007  DATE OF DISCHARGE:  05/11/2007                         DISCHARGE SUMMARY - REFERRING   DISCHARGE DIAGNOSES:  1. Unstable angina.  2. Coronary artery disease.  3. Status post bare metal stent to the right coronary artery.  4. Tobacco use.  5. Hyperlipidemia, specifically triglycerides.  6. History as previously.   PROCEDURES PERFORMED:  Cardiac catheterization by Dr. Diona Browner on  May 10, 2007, with bare metal stenting by Dr. Excell Seltzer on May 10, 2007.   BRIEF HISTORY:  Mr. Lombardo is a 50 year old white male who presented  with a several-day history of exertional chest discomfort which he  describes as a squeezing sensation relieved by rest.  He has noted  increased frequency to the point that he can only walk about 50 feet  before the discomfort begins.  On presentation to the Emergency Room, he  was pain free.   Past medical history is notable for tobacco use, probable hypertension,  and hyperlipidemia.   LABORATORY DATA:  Admission H&H is 13.2 and 37.4.  Normal indices.  Platelets are 330.  WBC is 7.7.  Prior to discharge, H&H was 12.1 and  33.9.  Normal indices.  Platelets were 276.  WBC was 6.8.  PTT 51, PT  12.5.  Sodium 138, potassium 3.9.  BUN 12, creatinine 1.0.  Normal LFTs.  Prior to discharge:  Sodium 139, potassium 39, BUN 9, creatinine 0.81,  glucose 110.  CK-MBs, relative indexes, and troponins were within normal  limits times four.  Postprocedure troponin was slightly elevated at  0.09.  Fasting lipids showed a total cholesterol of 198, triglycerides  665, and HDL 17.  LDL was not calculated.   Chest x-ray on May 09, 2007, showed no acute abnormalities.   EKGs showed  normal sinus rhythm and normal axis.  EKG prior to discharge  did show some small, insignificant inferior Q waves.   HOSPITAL COURSE:  Mr. Narang was initially seen and admitted by Dr.  Nevin Bloodgood and was placed on IV heparin as well as aspirin and beta blocker.  He was seen later that morning by Dr. Diona Browner.  It was felt that, given  his symptoms and risk factors, he should undergo a cardiac  catheterization.  He was loaded with Plavix.  Pharmacy assisted with  management of heparin.  Progression nurse assisted with discharge needs.   On May 10, 2007, Dr. Diona Browner performed cardiac catheterization.  Please refer to dictation.  He had three-vessel coronary artery disease,  predominately nonobstructive.  However, he did have an 80% very small  diagonal lesion and a 90% proximal RCA lesion.  EF was 55%, without wall  motion abnormalities.  After review with Dr. Excell Seltzer, it was felt that he  needed intervened upon on the RCA.  A bare metal stent was placed to the  RCA without difficulty by  Dr. Excell Seltzer.  Dr. Excell Seltzer felt that he should be  on aspirin indefinitely and Plavix for at least 30 days.   By May 11, 2007, the patient was ambulating without difficulty.  Catheterization site had some mild soreness but no changes.  Dr.  Diona Browner, after review, felt that the patient could be discharged home  with aggressive cardiac risk factor modification.  Prior to discharge,  the patient was seen by Cardiac Rehabilitation, who assisted with  ambulation and education as well as tobacco cessation consult.  He will  also be placed on a Nicoderm patch.   DISPOSITION:  Mr. Conde is discharged home.  He is asked to maintain a  low-sodium and heart-healthy diet.  Wound care and activities are per  supplemental sheet.  Dr. Diona Browner informed the patient he could return  to work on Monday as planned.   New medications for which he received her prescriptions include Plavix  75 mg daily, Lipitor 80 mg  nightly, Toprol-XL 50 mg daily, nitroglycerin  0.4 as needed, and Nicoderm patch 14 mg applied daily after removing old  patch.  He was given permission to continue his fish oil, multivitamin,  and B12 supplements.  His aspirin was increased to 325 mg.   He was asked to obtain a primary care physician prior to seeing Dr.  Diona Browner on May 31, 2007, at 10:15.  He was advised no smoking or  tobacco products and to bring all medications to all appointments.  He  will need blood work in approximately six to eight weeks given Lipitor  initiation.  Consideration may be given to referring him to the Lipid  Clinic given his triglycerides.   DISCHARGE TIME:  35 minutes.      Joellyn Rued, PA-C      Jonelle Sidle, MD  Electronically Signed    EW/MEDQ  D:  05/11/2007  T:  05/11/2007  Job:  [id number]   cc:   Jonelle Sidle, MD
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
## 2011-01-20 NOTE — Cardiovascular Report (Signed)
NAMEBERTRAND, VOWELS NO.:  [id number]   MEDICAL RECORD NO.:  [id number]          PATIENT TYPE:  INP   LOCATION:  3728                         FACILITY:  [facility]   PHYSICIAN:  Jonelle Sidle, MD DATE OF BIRTH:  [birth year]   DATE OF PROCEDURE:  05/10/2007  DATE OF DISCHARGE:                            CARDIAC CATHETERIZATION   REQUESTING CARDIOLOGIST:  Jonelle Sidle, M.D.   INDICATIONS:  [name] is a 50 year old male with hypertension,  longstanding tobacco use and hyperlipidemia.  He is now admitted to the  hospital with symptoms concerning for unstable angina and has ruled out  for myocardial infarction.  He is referred now for a diagnostic cardiac  catheterization to clearly define the coronary anatomy and assess for  any revascularizations options.  The potential risks and benefits were  explained him in advance, and informed consent was obtained.   PROCEDURES PERFORMED:  1. Left heart catheterization.  2. Selective coronary angiography.  3. Left ventriculography.   ACCESS AND EQUIPMENT:  The area about the right femoral artery was  anesthetized with 1% lidocaine, and a 6-French sheath was placed in the  right femoral artery via the modified Seldinger technique.  Standard  preformed 6-French JL-4 and JR-4 catheters were used for selective  coronary angiography, and an angled pigtail catheter was used for left  heart catheterization and left ventriculography.  A total of 130 mL  Omnipaque were used.  All exchange were made over wire.  The patient  tolerated procedure well without any immediate complications.   HEMODYNAMIC RESULTS:  Aorta 116/78 mmHg.  Left ventricle 118/16 mmHg.   ANGIOGRAPHIC FINDINGS:  1. Left main coronary artery is relatively large and gives rise to      left anterior descending, ramus intermedius and circumflex vessels.      There is approximately 30% distal narrowing of the left main      coronary artery without any  flow-limiting stenosis.  2. The left anterior descending is a medium-caliber vessel with two      diagonal branches.  There are minor luminal irregularities noted      throughout the vessel of approximately 20% without any flow-      limiting stenoses.  3. There is a medium-caliber ramus intermedius with 20% diffuse      stenosis in the proximal segment.  4. The circumflex vessel is medium in caliber.  There is a small      proximal obtuse marginal branch that a exhibits 80% diffuse      stenosis in its proximal segment.  5. There is a very large right coronary artery that is dominant.      Within the mid vessel segment is a focal 90% eccentric stenosis.      Surrounding the stenosis are areas of mild 20% diffuse      atherosclerosis.   Ventriculography was performed in the RAO projection and revealed  ejection fraction of approximately 55% with no focal wall motion  abnormality and no significant mitral regurgitation.   DIAGNOSES:  1. Coronary disease as outlined including an 80% stenosis involving  a      small obtuse marginal branch and, more importantly, a focal 90%      stenosis within the mid portion of a large dominant right coronary      artery.  2. Left ventricular ejection fraction approximately 55% with no      significant wall motion abnormality.  No mitral regurgitation. and      left ventricular end-diastolic pressure was 16 mmHg.   DISCUSSION:  I reviewed results with the patient and also discussed the  films with Dr. Excell Seltzer.  Anticipate proceeding with a percutaneous  intervention to treat the right coronary artery and otherwise aggressive  risk factor modification.      Jonelle Sidle, MD  Electronically Signed     SGM/MEDQ  D:  05/10/2007  T:  05/10/2007  Job:  [id number]

## 2011-06-08 LAB — PROTIME-INR
INR: 1.4
INR: 1.8 — ABNORMAL HIGH
INR: 2.2 — ABNORMAL HIGH
INR: 2.4 — ABNORMAL HIGH
INR: 2.6 — ABNORMAL HIGH
INR: 3.1 — ABNORMAL HIGH
Prothrombin Time: 18.2 — ABNORMAL HIGH
Prothrombin Time: 22.4 — ABNORMAL HIGH
Prothrombin Time: 25.9 — ABNORMAL HIGH
Prothrombin Time: 27.8 — ABNORMAL HIGH
Prothrombin Time: 29.9 — ABNORMAL HIGH
Prothrombin Time: 33.9 — ABNORMAL HIGH

## 2011-06-08 LAB — CARDIAC PANEL(CRET KIN+CKTOT+MB+TROPI)
CK, MB: 0.5
Total CK: 27
Troponin I: 0.02
Troponin I: 0.03
Troponin I: 0.04

## 2011-06-08 LAB — URINALYSIS, ROUTINE W REFLEX MICROSCOPIC
Glucose, UA: NEGATIVE
Hgb urine dipstick: NEGATIVE
Specific Gravity, Urine: 1.022
Urobilinogen, UA: 4 — ABNORMAL HIGH

## 2011-06-08 LAB — POCT I-STAT, CHEM 8
Creatinine, Ser: 1.1
Hemoglobin: 11.2 — ABNORMAL LOW
Potassium: 4
Sodium: 136

## 2011-06-08 LAB — COMPREHENSIVE METABOLIC PANEL
ALT: 17
ALT: 28
Alkaline Phosphatase: 102
Alkaline Phosphatase: 112
CO2: 27
CO2: 29
Calcium: 8.6
Chloride: 100
Chloride: 101
GFR calc non Af Amer: >60
GFR calc non Af Amer: >60
Glucose, Bld: 106 — ABNORMAL HIGH
Glucose, Bld: 107 — ABNORMAL HIGH
Potassium: 3.7
Potassium: 3.8
Sodium: 134 — ABNORMAL LOW
Sodium: 134 — ABNORMAL LOW
Total Bilirubin: 0.7

## 2011-06-08 LAB — CBC
HCT: 28.6 — ABNORMAL LOW
HCT: 28.9 — ABNORMAL LOW
Hemoglobin: 10.8 — ABNORMAL LOW
Hemoglobin: 9.4 — ABNORMAL LOW
Hemoglobin: 9.6 — ABNORMAL LOW
MCHC: 33.3
Platelets: 689 — ABNORMAL HIGH
Platelets: 692 — ABNORMAL HIGH
Platelets: 909
RBC: 2.91 — ABNORMAL LOW
RBC: 3.09 — ABNORMAL LOW
RBC: 3.56 — ABNORMAL LOW
RDW: 13.8
RDW: 14.7
WBC: 12.6 — ABNORMAL HIGH
WBC: 5.9
WBC: 7.8
WBC: 9

## 2011-06-08 LAB — DIFFERENTIAL
Basophils Relative: 1
Eosinophils Absolute: 0.4
Neutrophils Relative %: 73

## 2011-06-08 LAB — PATHOLOGIST SMEAR REVIEW

## 2011-06-08 LAB — BASIC METABOLIC PANEL
Chloride: 103
GFR calc Af Amer: >60
GFR calc non Af Amer: >60
Potassium: 4.2
Sodium: 137

## 2011-06-08 LAB — URINE CULTURE

## 2011-06-19 LAB — I-STAT 8, (EC8 V) (CONVERTED LAB)
BUN: 11
Bicarbonate: 23
Chloride: 106
Glucose, Bld: 113 — ABNORMAL HIGH
HCT: 41
Hemoglobin: 13.9
Sodium: 139
pCO2, Ven: 41.1 — ABNORMAL LOW

## 2011-06-19 LAB — COMPREHENSIVE METABOLIC PANEL
AST: 28
Albumin: 4.2
BUN: 12
Calcium: 9.1
Chloride: 104
Creatinine, Ser: 1
GFR calc Af Amer: >60
Total Bilirubin: 0.7
Total Protein: 6.7

## 2011-06-19 LAB — CARDIAC PANEL(CRET KIN+CKTOT+MB+TROPI)
CK, MB: 1.5
CK, MB: 1.6
Relative Index: 1.5
Relative Index: 1.5
Relative Index: INVALID
Troponin I: 0.02
Troponin I: 0.09 — ABNORMAL HIGH

## 2011-06-19 LAB — BASIC METABOLIC PANEL
BUN: 11
BUN: 9
CO2: 26
Calcium: 8.7
Calcium: 9
Creatinine, Ser: 0.84
GFR calc non Af Amer: >60
Glucose, Bld: 110 — ABNORMAL HIGH
Glucose, Bld: 94
Sodium: 139

## 2011-06-19 LAB — DIFFERENTIAL
Basophils Absolute: 0
Basophils Absolute: 0
Basophils Relative: 1
Eosinophils Relative: 2
Eosinophils Relative: 3
Lymphocytes Relative: 51 — ABNORMAL HIGH
Lymphs Abs: 3.9 — ABNORMAL HIGH
Monocytes Absolute: 0.5
Monocytes Absolute: 0.5
Monocytes Relative: 6
Neutro Abs: 3.1
Neutro Abs: 3.7

## 2011-06-19 LAB — CBC
HCT: 37.4 — ABNORMAL LOW
HCT: 38.1 — ABNORMAL LOW
Hemoglobin: 12.1 — ABNORMAL LOW
MCHC: 35.6
Platelets: 276
Platelets: 298
Platelets: 330
RDW: 12.5
RDW: 12.8
RDW: 12.9
WBC: 7.7
WBC: 8.6

## 2011-06-19 LAB — LIPID PANEL
HDL: 17 — ABNORMAL LOW
Total CHOL/HDL Ratio: 11.6
Triglycerides: 665 — ABNORMAL HIGH

## 2011-06-19 LAB — CK TOTAL AND CKMB (NOT AT ARMC)
CK, MB: 1.8
Relative Index: 1.4
Total CK: 126

## 2011-06-19 LAB — POCT I-STAT CREATININE
Creatinine, Ser: 1.1
Operator id: 277751

## 2011-06-19 LAB — POCT CARDIAC MARKERS
Myoglobin, poc: 66.6
Operator id: 277751

## 2011-06-19 LAB — PROTIME-INR
INR: 0.9
Prothrombin Time: 12.5

## 2011-06-19 LAB — HEPARIN LEVEL (UNFRACTIONATED)
Heparin Unfractionated: 0.32
Heparin Unfractionated: <0.1 — ABNORMAL LOW

## 2011-10-19 ENCOUNTER — Telehealth: Payer: Self-pay | Admitting: Cardiovascular Disease

## 2011-10-22 ENCOUNTER — Encounter: Payer: Self-pay | Admitting: Cardiovascular Disease

## 2011-10-22 ENCOUNTER — Encounter: Payer: Self-pay | Admitting: *Deleted

## 2011-10-22 ENCOUNTER — Ambulatory Visit (INDEPENDENT_AMBULATORY_CARE_PROVIDER_SITE_OTHER): Payer: Medicare Other | Admitting: Cardiovascular Disease

## 2011-10-22 DIAGNOSIS — E7800 Pure hypercholesterolemia, unspecified: Secondary | ICD-10-CM | POA: Diagnosis not present

## 2011-10-22 DIAGNOSIS — I2510 Atherosclerotic heart disease of native coronary artery without angina pectoris: Secondary | ICD-10-CM | POA: Diagnosis not present

## 2011-10-22 DIAGNOSIS — E785 Hyperlipidemia, unspecified: Secondary | ICD-10-CM

## 2011-10-22 DIAGNOSIS — I10 Essential (primary) hypertension: Secondary | ICD-10-CM | POA: Diagnosis not present

## 2011-10-22 NOTE — Assessment & Plan Note (Signed)
The patient is due for lipids and LFTs. He will likely need to resume a statin drug once these studies are available for review.

## 2011-10-22 NOTE — Assessment & Plan Note (Signed)
Blood pressure is within limits on no medicines at present.

## 2011-10-22 NOTE — Patient Instructions (Signed)
Your physician wants you to follow-up in: 1 YEAR.  You will receive a reminder letter in the mail two months in advance. If you don't receive a letter, please call our office to schedule the follow-up appointment.  Your physician recommends that you have a FASTING LIPID, LIVER, BMP and CBC--nothing to eat or drink after midnight, lab opens at 8:30.  Your physician recommends that you continue on your current medications as directed. Please refer to the Current Medication list given to you today.

## 2011-10-22 NOTE — Progress Notes (Signed)
HPI:  51 year old gentleman presented for followup evaluation. The patient has coronary artery disease and underwent stenting of the right coronary artery in 2008 using a bare-metal stent after presented with unstable angina. He also has a history of thromboembolism with DVT and pulmonary embolus after sustaining a leg fracture in 2009.  The patient is currently doing well without symptoms of chest pain or pressure. He denies edema, palpitations, lightheadedness, or syncope. He does complain of dyspnea with exertion. He continues to smoke between one half pack of one pack of cigarettes daily. He denies orthopnea or PND.  Outpatient Encounter Prescriptions as of 10/22/2011  Medication Sig Dispense Refill  . aspirin 81 MG tablet Take 81 mg by mouth daily.      . fish oil-omega-3 fatty acids 1000 MG capsule Take 2 g by mouth daily.      . methylphenidate (RITALIN) 20 MG tablet Take 20 mg by mouth daily.      . Multiple Vitamin (MULTIVITAMIN) tablet Take 1 tablet by mouth daily.      Marland Kitchen DISCONTD: atorvastatin (LIPITOR) 40 MG tablet Take 40 mg by mouth daily.      Marland Kitchen DISCONTD: lisinopril-hydrochlorothiazide (PRINZIDE,ZESTORETIC) 20-12.5 MG per tablet Take 1 tablet by mouth daily.      Marland Kitchen DISCONTD: traZODone (DESYREL) 100 MG tablet Take 100 mg by mouth as needed.      Marland Kitchen DISCONTD: vitamin B-12 (CYANOCOBALAMIN) 500 MCG tablet Take 500 mcg by mouth daily.        No Known Allergies  Past Medical History  Diagnosis Date  . CAD (coronary artery disease) 8/08    cath, bare metal  . HTN (hypertension)   . Hyperlipidemia   . Liver laceration 9/09  . Splenic laceration 9/09  . Pneumothorax   . Pulmonary embolus 9/09  . DVT (deep venous thrombosis) 9/09    left leg found after ortho surgery  . 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 Rib fractures 9/09    x 3  . Hypogonadism male     ROS: Negative except as per HPI  BP 138/82  Pulse 76  Ht 6\' 2"  (1.88 m)  Wt 109.317 kg (241  lb)  BMI 30.94 kg/m2  PHYSICAL EXAM: Pt is alert and oriented, NAD HEENT: normal Neck: JVP - normal, carotids 2+= without bruits Lungs: CTA bilaterally CV: RRR without murmur or gallop Abd: soft, NT, Positive BS, no hepatomegaly Ext: no C/C/E, distal pulses intact and equal Skin: warm/dry no rash  EKG:  Normal sinus rhythm 76 beats per minute, within normal limits.  ASSESSMENT AND PLAN:

## 2011-10-22 NOTE — Assessment & Plan Note (Signed)
The patient is stable without angina. We discussed his presentation back in 2008 and at that time he had clear chest burning with exertion. The symptoms have not recurred since he underwent a stenting procedure. Focus now is on risk factor modification. We had a long discussion about the importance of tobacco cessation. We also discussed the need to get back on a statin drug. He will followup in one year.

## 2011-11-03 ENCOUNTER — Other Ambulatory Visit: Payer: BC Managed Care – PPO

## 2012-02-26 DIAGNOSIS — F0630 Mood disorder due to known physiological condition, unspecified: Secondary | ICD-10-CM | POA: Diagnosis not present

## 2012-02-26 DIAGNOSIS — G3184 Mild cognitive impairment, so stated: Secondary | ICD-10-CM | POA: Diagnosis not present

## 2012-10-17 DIAGNOSIS — M25559 Pain in unspecified hip: Secondary | ICD-10-CM | POA: Diagnosis not present

## 2012-10-17 DIAGNOSIS — M5430 Sciatica, unspecified side: Secondary | ICD-10-CM | POA: Diagnosis not present

## 2012-11-03 DIAGNOSIS — I7090 Unspecified atherosclerosis: Secondary | ICD-10-CM | POA: Diagnosis not present

## 2012-11-03 DIAGNOSIS — I10 Essential (primary) hypertension: Secondary | ICD-10-CM | POA: Diagnosis not present

## 2012-11-03 DIAGNOSIS — I2510 Atherosclerotic heart disease of native coronary artery without angina pectoris: Secondary | ICD-10-CM | POA: Diagnosis not present

## 2012-11-03 DIAGNOSIS — M25559 Pain in unspecified hip: Secondary | ICD-10-CM | POA: Diagnosis not present

## 2012-11-03 DIAGNOSIS — Z79899 Other long term (current) drug therapy: Secondary | ICD-10-CM | POA: Diagnosis not present

## 2012-11-09 ENCOUNTER — Ambulatory Visit: Payer: Self-pay | Admitting: Internal Medicine

## 2012-11-10 DIAGNOSIS — Z1211 Encounter for screening for malignant neoplasm of colon: Secondary | ICD-10-CM | POA: Diagnosis not present

## 2013-01-02 ENCOUNTER — Ambulatory Visit: Payer: Self-pay | Admitting: Gastroenterology

## 2013-01-03 LAB — PATHOLOGY REPORT

## 2013-04-02 LAB — COMPREHENSIVE METABOLIC PANEL
Alkaline Phosphatase: 75 U/L (ref 50–136)
Anion Gap: 8 (ref 7–16)
BUN: 12 mg/dL (ref 7–18)
Bilirubin,Total: 0.2 mg/dL (ref 0.2–1.0)
Chloride: 107 mmol/L (ref 98–107)
Co2: 25 mmol/L (ref 21–32)
Creatinine: 1.11 mg/dL (ref 0.60–1.30)
EGFR (Non-African Amer.): >60
Glucose: 125 mg/dL — ABNORMAL HIGH (ref 65–99)
Osmolality: 281 (ref 275–301)
Potassium: 3 mmol/L — ABNORMAL LOW (ref 3.5–5.1)
SGOT(AST): 20 U/L (ref 15–37)
SGPT (ALT): 26 U/L (ref 12–78)
Sodium: 140 mmol/L (ref 136–145)

## 2013-04-02 LAB — CK TOTAL AND CKMB (NOT AT ARMC)
CK, Total: 98 U/L (ref 35–232)
CK-MB: <0.5 ng/mL — ABNORMAL LOW (ref 0.5–3.6)

## 2013-04-02 LAB — CBC
HCT: 38.6 % — ABNORMAL LOW (ref 40.0–52.0)
HGB: 13.3 g/dL (ref 13.0–18.0)
MCHC: 34.3 g/dL (ref 32.0–36.0)
MCV: 92 fL (ref 80–100)
Platelet: 426 10*3/uL (ref 150–440)
RDW: 14 % (ref 11.5–14.5)
WBC: 13.6 10*3/uL — ABNORMAL HIGH (ref 3.8–10.6)

## 2013-04-02 LAB — TROPONIN I: Troponin-I: <0.02 ng/mL

## 2013-04-03 ENCOUNTER — Observation Stay: Payer: Self-pay | Admitting: Family Medicine

## 2013-04-03 LAB — URINALYSIS, COMPLETE
Bilirubin,UR: NEGATIVE
Blood: NEGATIVE
Glucose,UR: NEGATIVE mg/dL (ref 0–75)
Hyaline Cast: 4
Ketone: NEGATIVE
Leukocyte Esterase: NEGATIVE
Nitrite: NEGATIVE
Ph: 6 (ref 4.5–8.0)
Protein: 30
RBC,UR: 2 /HPF (ref 0–5)
Specific Gravity: 1.017 (ref 1.003–1.030)
WBC UR: 4 /HPF (ref 0–5)

## 2013-04-03 LAB — DRUG SCREEN, URINE
Amphetamines, Ur Screen: NEGATIVE (ref ?–1000)
Barbiturates, Ur Screen: NEGATIVE (ref ?–200)
Benzodiazepine, Ur Scrn: NEGATIVE (ref ?–200)
Cannabinoid 50 Ng, Ur ~~LOC~~: POSITIVE (ref ?–50)
MDMA (Ecstasy)Ur Screen: POSITIVE (ref ?–500)
Methadone, Ur Screen: NEGATIVE (ref ?–300)
Opiate, Ur Screen: POSITIVE (ref ?–300)
Tricyclic, Ur Screen: NEGATIVE (ref ?–1000)

## 2013-04-03 LAB — MAGNESIUM: Magnesium: 1.8 mg/dL

## 2013-04-03 LAB — CK TOTAL AND CKMB (NOT AT ARMC): CK-MB: 1.4 ng/mL (ref 0.5–3.6)

## 2013-04-03 LAB — POTASSIUM: Potassium: 4.1 mmol/L (ref 3.5–5.1)

## 2013-04-04 LAB — LIPID PANEL: Ldl Cholesterol, Calc: 80 mg/dL (ref 0–100)

## 2013-12-11 ENCOUNTER — Encounter (HOSPITAL_COMMUNITY): Payer: Self-pay | Admitting: Emergency Medicine

## 2013-12-11 ENCOUNTER — Emergency Department (HOSPITAL_COMMUNITY)
Admission: EM | Admit: 2013-12-11 | Discharge: 2013-12-12 | Disposition: A | Discharge status: Home / Self Care | Payer: Medicare Other | Attending: Emergency Medicine | Admitting: Emergency Medicine

## 2013-12-11 DIAGNOSIS — Z8781 Personal history of (healed) traumatic fracture: Secondary | ICD-10-CM | POA: Insufficient documentation

## 2013-12-11 DIAGNOSIS — Z87828 Personal history of other (healed) physical injury and trauma: Secondary | ICD-10-CM | POA: Insufficient documentation

## 2013-12-11 DIAGNOSIS — I10 Essential (primary) hypertension: Secondary | ICD-10-CM | POA: Insufficient documentation

## 2013-12-11 DIAGNOSIS — F17200 Nicotine dependence, unspecified, uncomplicated: Secondary | ICD-10-CM | POA: Insufficient documentation

## 2013-12-11 DIAGNOSIS — Z86718 Personal history of other venous thrombosis and embolism: Secondary | ICD-10-CM | POA: Insufficient documentation

## 2013-12-11 DIAGNOSIS — Z87820 Personal history of traumatic brain injury: Secondary | ICD-10-CM | POA: Insufficient documentation

## 2013-12-11 DIAGNOSIS — Z87448 Personal history of other diseases of urinary system: Secondary | ICD-10-CM | POA: Insufficient documentation

## 2013-12-11 DIAGNOSIS — Z9889 Other specified postprocedural states: Secondary | ICD-10-CM | POA: Insufficient documentation

## 2013-12-11 DIAGNOSIS — I2510 Atherosclerotic heart disease of native coronary artery without angina pectoris: Secondary | ICD-10-CM | POA: Insufficient documentation

## 2013-12-11 DIAGNOSIS — E785 Hyperlipidemia, unspecified: Secondary | ICD-10-CM | POA: Insufficient documentation

## 2013-12-11 DIAGNOSIS — F1110 Opioid abuse, uncomplicated: Secondary | ICD-10-CM | POA: Insufficient documentation

## 2013-12-11 DIAGNOSIS — Z79899 Other long term (current) drug therapy: Secondary | ICD-10-CM | POA: Insufficient documentation

## 2013-12-11 DIAGNOSIS — F1994 Other psychoactive substance use, unspecified with psychoactive substance-induced mood disorder: Secondary | ICD-10-CM | POA: Diagnosis present

## 2013-12-11 DIAGNOSIS — Z86711 Personal history of pulmonary embolism: Secondary | ICD-10-CM | POA: Insufficient documentation

## 2013-12-11 DIAGNOSIS — F1910 Other psychoactive substance abuse, uncomplicated: Secondary | ICD-10-CM | POA: Diagnosis present

## 2013-12-11 DIAGNOSIS — R45851 Suicidal ideations: Secondary | ICD-10-CM | POA: Insufficient documentation

## 2013-12-11 DIAGNOSIS — Z7982 Long term (current) use of aspirin: Secondary | ICD-10-CM | POA: Insufficient documentation

## 2013-12-11 LAB — CBC
HEMATOCRIT: 38.7 % — AB (ref 39.0–52.0)
HEMOGLOBIN: 13.9 g/dL (ref 13.0–17.0)
MCH: 33.4 pg (ref 26.0–34.0)
MCHC: 35.9 g/dL (ref 30.0–36.0)
MCV: 93 fL (ref 78.0–100.0)
Platelets: 345 10*3/uL (ref 150–400)
RBC: 4.16 MIL/uL — ABNORMAL LOW (ref 4.22–5.81)
RDW: 12.5 % (ref 11.5–15.5)
WBC: 12 10*3/uL — AB (ref 4.0–10.5)

## 2013-12-11 LAB — COMPREHENSIVE METABOLIC PANEL
ALBUMIN: 4.1 g/dL (ref 3.5–5.2)
ALK PHOS: 70 U/L (ref 39–117)
ALT: 15 U/L (ref 0–53)
AST: 21 U/L (ref 0–37)
BILIRUBIN TOTAL: 0.6 mg/dL (ref 0.3–1.2)
BUN: 15 mg/dL (ref 6–23)
CHLORIDE: 100 meq/L (ref 96–112)
CO2: 23 meq/L (ref 19–32)
CREATININE: 0.82 mg/dL (ref 0.50–1.35)
Calcium: 9.3 mg/dL (ref 8.4–10.5)
GLUCOSE: 136 mg/dL — AB (ref 70–99)
POTASSIUM: 4 meq/L (ref 3.7–5.3)
Sodium: 139 mEq/L (ref 137–147)
Total Protein: 7 g/dL (ref 6.0–8.3)

## 2013-12-11 LAB — RAPID URINE DRUG SCREEN, HOSP PERFORMED
Amphetamines: NOT DETECTED
BARBITURATES: NOT DETECTED
BENZODIAZEPINES: NOT DETECTED
Cocaine: NOT DETECTED
Opiates: NOT DETECTED
Tetrahydrocannabinol: NOT DETECTED

## 2013-12-11 LAB — ETHANOL

## 2013-12-11 LAB — SALICYLATE LEVEL

## 2013-12-11 LAB — ACETAMINOPHEN LEVEL

## 2013-12-11 MED ORDER — ACETAMINOPHEN 325 MG PO TABS
650.0000 mg | ORAL_TABLET | ORAL | Status: DC | PRN
  Administered 2013-12-12 (×2): 650 mg via ORAL
  Filled 2013-12-11 (×2): qty 2

## 2013-12-11 MED ORDER — ALUM & MAG HYDROXIDE-SIMETH 200-200-20 MG/5ML PO SUSP: 30.0000 mL | ORAL | Status: DC | PRN

## 2013-12-11 MED ORDER — ZOLPIDEM TARTRATE 5 MG PO TABS
5.0000 mg | ORAL_TABLET | Freq: Every evening | ORAL | Status: DC | PRN
  Administered 2013-12-11: 5 mg via ORAL
  Filled 2013-12-11: qty 1

## 2013-12-11 MED ORDER — ASPIRIN EC 81 MG PO TBEC
81.0000 mg | DELAYED_RELEASE_TABLET | Freq: Every day | ORAL | Status: DC
  Administered 2013-12-11 – 2013-12-12 (×2): 81 mg via ORAL
  Filled 2013-12-11 (×3): qty 1

## 2013-12-11 MED ORDER — NICOTINE 21 MG/24HR TD PT24
21.0000 mg | MEDICATED_PATCH | Freq: Every day | TRANSDERMAL | Status: DC
  Administered 2013-12-11 – 2013-12-12 (×2): 21 mg via TRANSDERMAL
  Filled 2013-12-11 (×2): qty 1

## 2013-12-11 MED ORDER — IBUPROFEN 400 MG PO TABS: 600.0000 mg | ORAL_TABLET | Freq: Three times a day (TID) | ORAL | Status: DC | PRN

## 2013-12-11 MED ORDER — ONDANSETRON HCL 4 MG PO TABS: 4.0000 mg | ORAL_TABLET | Freq: Three times a day (TID) | ORAL | Status: DC | PRN

## 2013-12-11 MED ORDER — METHYLPHENIDATE HCL 5 MG PO TABS
20.0000 mg | ORAL_TABLET | Freq: Every day | ORAL | Status: DC
  Administered 2013-12-11 – 2013-12-12 (×2): 20 mg via ORAL
  Filled 2013-12-11 (×2): qty 4

## 2013-12-11 MED ORDER — CITALOPRAM HYDROBROMIDE 10 MG PO TABS
30.0000 mg | ORAL_TABLET | Freq: Every day | ORAL | Status: DC
  Administered 2013-12-11 – 2013-12-12 (×2): 30 mg via ORAL
  Filled 2013-12-11 (×2): qty 3

## 2013-12-11 MED ORDER — LORAZEPAM 1 MG PO TABS
1.0000 mg | ORAL_TABLET | Freq: Three times a day (TID) | ORAL | Status: DC | PRN
  Administered 2013-12-11 – 2013-12-12 (×4): 1 mg via ORAL
  Filled 2013-12-11 (×4): qty 1

## 2013-12-11 MED ORDER — METOPROLOL SUCCINATE ER 50 MG PO TB24
50.0000 mg | ORAL_TABLET | Freq: Every day | ORAL | Status: DC
  Administered 2013-12-11 – 2013-12-12 (×2): 50 mg via ORAL
  Filled 2013-12-11 (×2): qty 1

## 2013-12-11 MED ORDER — ASPIRIN 81 MG PO TABS
81.0000 mg | ORAL_TABLET | Freq: Every day | ORAL | Status: DC
  Filled 2013-12-11 (×2): qty 1
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
## 2013-12-11 NOTE — ED Notes (Signed)
Gave pt water and turkey sandwich 

## 2013-12-11 NOTE — ED Notes (Signed)
AC aware of need for sitter 
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
## 2013-12-11 NOTE — ED Notes (Signed)
Patient denies pain but states he is anxious and needs something for his anxiety.

## 2013-12-11 NOTE — ED Notes (Signed)
Pt in room. Pt is resting but feels anxious. Will give anti anxiety medication. Sitter at bedside.

## 2013-12-11 NOTE — ED Notes (Signed)
Pt wanded by security. Valuables given to security. Other belongings bagged and inventoried.

## 2013-12-12 DIAGNOSIS — F1910 Other psychoactive substance abuse, uncomplicated: Secondary | ICD-10-CM | POA: Diagnosis present

## 2013-12-12 DIAGNOSIS — F4310 Post-traumatic stress disorder, unspecified: Secondary | ICD-10-CM

## 2013-12-12 DIAGNOSIS — F1994 Other psychoactive substance use, unspecified with psychoactive substance-induced mood disorder: Secondary | ICD-10-CM | POA: Diagnosis present

## 2013-12-12 DIAGNOSIS — F332 Major depressive disorder, recurrent severe without psychotic features: Secondary | ICD-10-CM

## 2013-12-12 NOTE — Progress Notes (Signed)
Per NP, [name] the patient meets criteria for detox.  The  [facility] Dispo Tech will refer patient to other facilities.
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
## 2013-12-12 NOTE — Consult Note (Signed)
Case discussed, agree with plan 

## 2013-12-12 NOTE — Discharge Instructions (Signed)
Chemical Dependency °Chemical dependency is an addiction to drugs or alcohol. It is characterized by the repeated behavior of seeking out and using drugs and alcohol despite harmful consequences to the health and safety of ones self and others.  °RISK FACTORS °There are certain situations or behaviors that increase a person's risk for chemical dependency. These include: °· A family history of chemical dependency. °· A history of mental health issues, including depression and anxiety. °· A home environment where drugs and alcohol are easily available to you. °· Drug or alcohol use at a young age. °SYMPTOMS  °The following symptoms can indicate chemical dependency: °· Inability to limit the use of drugs or alcohol. °· Nausea, sweating, shakiness, and anxiety that occurs when alcohol or drugs are not being used. °· An increase in amount of drugs or alcohol that is necessary to get drunk or high. °People who experience these symptoms can assess their use of drugs and alcohol by asking themselves the following questions: °· Have you been told by friends or family that they are worried about your use of alcohol or drugs? °· Do friends and family ever tell you about things you did while drinking alcohol or using drugs that you do not remember? °· Do you lie about using alcohol or drugs or about the amounts you use? °· Do you have difficulty completing daily tasks unless you use alcohol or drugs? °· Is the level of your work or school performance lower because of your drug or alcohol use? °· Do you get sick from using drugs or alcohol but keep using anyway? °· Do you feel uncomfortable in social situations unless you use alcohol or drugs? °· Do you use drugs or alcohol to help forget problems?  °An answer of yes to any of these questions may indicate chemical dependency. Professional evaluation is suggested. °Document Released: 08/18/2001 Document Revised: 11/16/2011 Document Reviewed: 10/30/2010 °ExitCare® Patient  Information ©2014 [employer]. ° ° ° ° ° °Emergency Department Resource Guide °1) Find a Doctor and Pay Out of Pocket °Although you won't have to find out who is covered by your insurance plan, it is a good idea to ask around and get recommendations. You will then need to call the office and see if the doctor you have chosen will accept you as a new patient and what types of options they offer for patients who are self-pay. Some doctors offer discounts or will set up payment plans for their patients who do not have insurance, but you will need to ask so you aren't surprised when you get to your appointment. ° °2) Contact Your Local Health Department °Not all health departments have doctors that can see patients for sick visits, but many do, so it is worth a call to see if yours does. If you don't know where your local health department is, you can check in your phone book. The CDC also has a tool to help you locate your state's health department, and many state websites also have listings of all of their local health departments. ° °3) Find a Walk-in Clinic °If your illness is not likely to be very severe or complicated, you may want to try a walk in clinic. These are popping up all over the country in pharmacies, drugstores, and shopping centers. They're usually staffed by nurse practitioners or physician assistants that have been trained to treat common illnesses and complaints. They're usually fairly quick and inexpensive. However, if you have serious medical issues or chronic medical problems, these   are probably not your best option. ° °No Primary Care Doctor: °- Call [facility] at  832-8000 - they can help you locate a primary care doctor that  accepts your insurance, provides certain services, etc. °- Physician Referral Service- 1-800-533-3463 ° °Chronic Pain Problems: °Organization         Address  Phone   Notes  °[facility]  (336) 297-2271 Patients need to be referred by their  primary care doctor.  ° °Medication Assistance: °Organization         Address  Phone   Notes  °[place] [facility] [address] °[place], [place] [address] (336) 641-8030 --Must be a resident of [place] °-- Must have NO insurance coverage whatsoever (no Medicaid/ Medicare, etc.) °-- The pt. MUST have a primary care doctor that directs their care regularly and follows them in the community °  °[facility]  (866) 331-1348   °[employer]  (888) 892-1162   ° °Agencies that provide inexpensive medical care: °Organization         Address  Phone   Notes  °[facility]  (336) 832-8035   °[facility]    (336) 832-7272   °[facility] [address] °[place], [place] [address] (336) 832-4777   °[facility] [address], °[place] (336) 271-4999   °[facility]    (336) 373-0678   °[facility]    (336) 272-1050   °[facility] ° [address], [place] Phone:  (336) 832-4444, Fax:  (336) 832-4440 Hours of Operation:  9 am - 6 pm, M-F.  Also accepts Medicaid/Medicare and self-pay.  °[facility] ° [address], [place] Phone: (336) 832-3150, Fax: (336) 832-3151. Hours of Operation:  8:30 am - 5:30 pm, M-F.  Also accepts Medicaid and self-pay.  °[facility] [place] [address], [place] Phone: (336) 878-6027   °[facility] [address], [place], [place] (336)723-1848, Ext. 123 Mondays & Thursdays: 7-9 AM.  First 15 patients are seen on a first come, first serve basis. °  ° °Medicaid-accepting [place] Providers: ° °Organization         Address  Phone   Notes  °[facility] [address], [place] (336) 641-2100 Also accepts self-pay patients.  °[facility] [address], [place] ° (336) 856-9996   °[facility] [address], [place] (336) 288-8857   °[facility] [address] [place] [address], [place] (336) 299-7000   °Veita Bland [address], [place]  ° (336) 373-1557 Only accepts [place] Access Medicaid patients after they have their name applied to their card.  ° °Self-Pay (no insurance) in [place]: ° °Organization         Address  Phone   Notes  °Sickle Cell Patients, [facility] [address], [place] (336) 832-1970   °[facility] [address], [place] (336) 832-4400   °[facility] [place] [facility] ° [address], [place] (336) 992-4800   °[facility]/Dr. Osei-Bonsu ° [address] [place] [address], [place] or [address], [place] (336) 841-8500 Phone number for both [place] and [place] locations is the same.  °[facility] [address], [place] (336) 299-0000   °[facility] [address] [place] [address], [place] or [address] (336) 852-7530 °(336) 878-2260   °[facility] [address], [place] (336) 350-1642, phone; (336) 294-5005, fax Sees patients 1st and 3rd Saturday of every month.  Must not qualify for public or private insurance (i.e. Medicaid, Medicare, [place] Health Choice, Veterans' Benefits) • Household income should be no more than 200% of the poverty level •The clinic cannot treat you if you are pregnant or think you are pregnant • Sexually transmitted diseases are not treated at the clinic.  ° ° °Dental Care: °Organization         Address  Phone  Notes  °[facility] [facility] [address], [place] (336) 641-6152 Accepts children up to age 21 who are enrolled in Medicaid or [place] Health Choice; pregnant women with a Medicaid card; and children who have applied for Medicaid or [place] Health Choice, but were declined, whose parents can pay a reduced fee  at time of service.  °[facility] [place]  [address], [place] (336) 641-7733 Accepts children up to age 21 who are enrolled in Medicaid or [place] Health Choice; pregnant women with a Medicaid card; and children who have applied for Medicaid or [place] Health Choice, but were declined, whose parents can pay a reduced fee at time of service.  °[facility] ° [address], [place] (336) 641-4533 Patients are seen by appointment only. Walk-ins are not accepted. [facility] will see patients 18 years of age and older. °Monday - Tuesday (8am-5pm) °Most Wednesdays (8:30-5pm) °$30 per visit, cash only  °[facility] ° [address], [place] (336) 641-4533 Patients are seen by appointment only. Walk-ins are not accepted. [facility] will see patients 18 years of age and older. °One Wednesday Evening (Monthly: Volunteer Based).  $30 per visit, cash only  °[facility]  (919) 537-3737 for adults; Children under age 4, call Graduate Pediatric Dentistry at ([phone]) [phone]. Children aged 4-14, please call (919) [phone] to request a pediatric application. ° Dental services are provided in all areas of dental care including fillings, crowns and bridges, complete and partial dentures, implants, gum treatment, root canals, and extractions. Preventive care is also provided. Treatment is provided to both adults and children. °Patients are selected via a lottery and there is often a waiting list. °  °[facility] [address], °[place] ° (336) 763-8833 [email or web address] °  °[facility] [address], [place], [place] (336)723-1848, Ext. 123 Second and Fourth Thursday of each month, opens at 6:30 AM; Clinic ends at 9 AM.  Patients are seen on a first-come first-served basis, and a limited number are seen during each clinic.  ° °[facility] ° [address],  [place], [place] (336) 723-7904   Eligibility Requirements °You must have lived in [place], [place], or [place] counties for at least the last three months. °  You cannot be eligible for state or federal sponsored healthcare insurance, including Veterans Administration, Medicaid, or Medicare. °  You generally cannot be eligible for healthcare insurance through your employer.  °  How to apply: °Eligibility screenings are held every Tuesday and Wednesday afternoon from 1:00 pm until 4:00 pm. You do not need an appointment for the interview!  °[facility] [address], [place], [place] 336-631-2330   °[facility]  336-342-8273   °[facility]  336-703-3100   °[facility]  336-570-6415   ° °Behavioral Health Resources in the Community: °Intensive Outpatient Programs °Organization         Address  Phone  Notes  °[facility] [address], [place], [place] 336-878-6098   °[facility] [address], [place], [place] 336-832-9800   °[facility] [address], [place], [place] ° 336-882-2125   °[facility] [address],  °[place], [place] 1-800-853-5163 or 336-641-4981   °Substance Abuse Resources °Organization         Address  Phone  Notes  °[facility]  336-882-2125   °[facility]  336-784-9470   °[facility]  336-285-9073   °[facility]  336-845-3988   °Residential & Outpatient Substance Abuse Program  1-800-659-3381   °Psychological Services °Organization         Address  Phone  Notes  °[facility]  336- 832-9600   °[facility]  336- 378-7881   °[facility] [address], [place] 1-800-853-5163 or 336-641-4981   ° °Mobile Crisis Teams °Organization         Address  Phone  Notes  °[facility], Mobile Crisis Care Unit  1-877-626-1772   °Assertive °Psychotherapeutic  Services ° [address] [place], [place] 336-834-9664   °Sharon DeEsch [address] °[place] 336-554-5454   ° °Self-Help/Support Groups °Organization         Address  Phone             Notes  °[facility] [place] - variety of support groups  336- 373-1402 Call for more information  °Narcotics Anonymous (NA), [facility] [address], °[place]  2 meetings at this location  ° °Residential Treatment Programs °Organization         Address  Phone  Notes  °[facility] [address],    °[place]  1-866-801-8205   °[facility] ° [address], [place], [place] 704-293-8524   °[facility] [address], [place] 336-845-3988 Admissions: 8am-3pm M-F  °[facility] [address]    °[place], [place] 336-841-1104   °[facility] [address], [place], [place] 336-379-7146   °[facility] [address]  °[place], [place] 336-285-9073   °[facility] Intensive Outpatient [address], [place], [place] 336-852-3033   °[facility] [address]  °[place], [place] 1-877-615-2722 or 336-784-9470   °[facility] ([facility] [address] [place], [place] 336-227-7417 Accepts Medicaid  °[facility] [address]  °[place] 1-800-659-3381 Substance Abuse/Addiction Treatment  ° °[place] Behavioral Health Resources °Organization         Address  Phone  Notes  °[facility]  (888) 581-9988   °Julie Brannon, PhD [address] [place], [place]   (336) 349-5553 or (336) 951-0000   °[facility]   [address] °[place], [place] (336) 349-4454   °[facility] [address], [place], [place] (336) 342-8316 Insurance/Medicaid/sponsorship through [facility]  °[facility] [address]                                      [place], [place] (336)  342-8316 Therapy/tele-psych/case  °[facility] [address]  ° [place], [place] (336) 349-2233    °Dr. Arfeen  (336) 349-4544   °[facility] [place]  [employer] [place] [facility] 1) [address], [place] °2) [address], [place] °3)  [address], [place] (336) 349-3220 °(336) 342-7768 ° °(336) 342-8140   °[place] [facility] (336) 342-1394 or (336) 342-3537 (After Hours)    ° ° ° °

## 2013-12-12 NOTE — BH Assessment (Signed)
Beds available at [facility] and [facility]. [facility] does not accept Medicare. [facility] faxed referral to [place].  Boyce Medici, MA  Disposition MHT
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
## 2013-12-12 NOTE — ED Notes (Signed)
Patient states he wants something to help him sleep.

## 2013-12-12 NOTE — Progress Notes (Signed)
Writer consulted with the NP, Mickel Baas.  Writer informed Mickel Baas that during the TA the patient denied SI/HI/Psychosis.  Patient reports depression due to taking money out of the families account and using it for drugs.  Patient reports that he is able to contract for safety and wants outpatient substance abuse resources.   The NP, Mickel Baas will re-assess the patient. Writer informed the ER MD (Dr. Wilson Singer) and the nurse of the patients disposition.  The NP will re-assess the patient after 11am

## 2014-01-18 DIAGNOSIS — S069X9A Unspecified intracranial injury with loss of consciousness of unspecified duration, initial encounter: Secondary | ICD-10-CM | POA: Insufficient documentation

## 2014-01-18 DIAGNOSIS — I2510 Atherosclerotic heart disease of native coronary artery without angina pectoris: Secondary | ICD-10-CM | POA: Insufficient documentation

## 2014-01-18 DIAGNOSIS — G8929 Other chronic pain: Secondary | ICD-10-CM | POA: Insufficient documentation

## 2014-01-18 DIAGNOSIS — S069XAA Unspecified intracranial injury with loss of consciousness status unknown, initial encounter: Secondary | ICD-10-CM | POA: Insufficient documentation

## 2014-05-01 DIAGNOSIS — F329 Major depressive disorder, single episode, unspecified: Secondary | ICD-10-CM | POA: Diagnosis not present

## 2014-05-01 DIAGNOSIS — I10 Essential (primary) hypertension: Secondary | ICD-10-CM | POA: Diagnosis not present

## 2014-05-01 DIAGNOSIS — F3289 Other specified depressive episodes: Secondary | ICD-10-CM | POA: Diagnosis not present

## 2014-05-01 DIAGNOSIS — Z7189 Other specified counseling: Secondary | ICD-10-CM | POA: Diagnosis not present

## 2014-05-21 DIAGNOSIS — I10 Essential (primary) hypertension: Secondary | ICD-10-CM | POA: Diagnosis not present

## 2014-05-21 DIAGNOSIS — F988 Other specified behavioral and emotional disorders with onset usually occurring in childhood and adolescence: Secondary | ICD-10-CM | POA: Diagnosis not present

## 2014-05-21 DIAGNOSIS — F329 Major depressive disorder, single episode, unspecified: Secondary | ICD-10-CM | POA: Diagnosis not present

## 2014-05-21 DIAGNOSIS — Z7189 Other specified counseling: Secondary | ICD-10-CM | POA: Diagnosis not present

## 2014-05-21 DIAGNOSIS — F3289 Other specified depressive episodes: Secondary | ICD-10-CM | POA: Diagnosis not present

## 2014-05-25 DIAGNOSIS — F988 Other specified behavioral and emotional disorders with onset usually occurring in childhood and adolescence: Secondary | ICD-10-CM | POA: Diagnosis not present

## 2014-05-25 DIAGNOSIS — I10 Essential (primary) hypertension: Secondary | ICD-10-CM | POA: Diagnosis not present

## 2014-05-25 DIAGNOSIS — F3289 Other specified depressive episodes: Secondary | ICD-10-CM | POA: Diagnosis not present

## 2014-05-25 DIAGNOSIS — F329 Major depressive disorder, single episode, unspecified: Secondary | ICD-10-CM | POA: Diagnosis not present

## 2014-06-01 DIAGNOSIS — F988 Other specified behavioral and emotional disorders with onset usually occurring in childhood and adolescence: Secondary | ICD-10-CM | POA: Diagnosis not present

## 2014-06-01 DIAGNOSIS — G8929 Other chronic pain: Secondary | ICD-10-CM | POA: Diagnosis not present

## 2014-06-01 DIAGNOSIS — M542 Cervicalgia: Secondary | ICD-10-CM | POA: Diagnosis not present

## 2014-06-01 DIAGNOSIS — I10 Essential (primary) hypertension: Secondary | ICD-10-CM | POA: Diagnosis not present

## 2014-06-07 DIAGNOSIS — I10 Essential (primary) hypertension: Secondary | ICD-10-CM | POA: Diagnosis not present

## 2014-06-07 DIAGNOSIS — F900 Attention-deficit hyperactivity disorder, predominantly inattentive type: Secondary | ICD-10-CM | POA: Diagnosis not present

## 2014-06-14 DIAGNOSIS — F329 Major depressive disorder, single episode, unspecified: Secondary | ICD-10-CM | POA: Diagnosis not present

## 2014-06-14 DIAGNOSIS — F900 Attention-deficit hyperactivity disorder, predominantly inattentive type: Secondary | ICD-10-CM | POA: Diagnosis not present

## 2014-06-14 DIAGNOSIS — I10 Essential (primary) hypertension: Secondary | ICD-10-CM | POA: Diagnosis not present

## 2014-06-17 ENCOUNTER — Emergency Department: Payer: Self-pay | Admitting: Emergency Medicine

## 2014-06-17 DIAGNOSIS — K0521 Aggressive periodontitis, localized: Secondary | ICD-10-CM | POA: Diagnosis not present

## 2014-06-17 DIAGNOSIS — K089 Disorder of teeth and supporting structures, unspecified: Secondary | ICD-10-CM | POA: Diagnosis not present

## 2014-06-17 DIAGNOSIS — R6884 Jaw pain: Secondary | ICD-10-CM | POA: Diagnosis not present

## 2014-06-17 DIAGNOSIS — K029 Dental caries, unspecified: Secondary | ICD-10-CM | POA: Diagnosis not present

## 2014-06-17 DIAGNOSIS — Z720 Tobacco use: Secondary | ICD-10-CM | POA: Diagnosis not present

## 2014-07-02 DIAGNOSIS — Z1389 Encounter for screening for other disorder: Secondary | ICD-10-CM | POA: Diagnosis not present

## 2014-07-02 DIAGNOSIS — M545 Low back pain: Secondary | ICD-10-CM | POA: Diagnosis not present

## 2014-07-02 DIAGNOSIS — I10 Essential (primary) hypertension: Secondary | ICD-10-CM | POA: Diagnosis not present

## 2014-07-02 DIAGNOSIS — G8929 Other chronic pain: Secondary | ICD-10-CM | POA: Diagnosis not present

## 2014-07-12 ENCOUNTER — Ambulatory Visit: Payer: Self-pay | Admitting: Pain Medicine

## 2014-07-12 DIAGNOSIS — Z87828 Personal history of other (healed) physical injury and trauma: Secondary | ICD-10-CM | POA: Diagnosis not present

## 2014-07-12 DIAGNOSIS — M47812 Spondylosis without myelopathy or radiculopathy, cervical region: Secondary | ICD-10-CM | POA: Diagnosis not present

## 2014-07-12 DIAGNOSIS — M791 Myalgia: Secondary | ICD-10-CM | POA: Diagnosis not present

## 2014-07-12 DIAGNOSIS — M542 Cervicalgia: Secondary | ICD-10-CM | POA: Diagnosis not present

## 2014-07-12 DIAGNOSIS — M5033 Other cervical disc degeneration, cervicothoracic region: Secondary | ICD-10-CM | POA: Diagnosis not present

## 2014-08-14 DIAGNOSIS — F900 Attention-deficit hyperactivity disorder, predominantly inattentive type: Secondary | ICD-10-CM | POA: Diagnosis not present

## 2014-08-14 DIAGNOSIS — G8929 Other chronic pain: Secondary | ICD-10-CM | POA: Diagnosis not present

## 2014-08-14 DIAGNOSIS — I10 Essential (primary) hypertension: Secondary | ICD-10-CM | POA: Diagnosis not present

## 2014-08-14 DIAGNOSIS — M545 Low back pain: Secondary | ICD-10-CM | POA: Diagnosis not present

## 2014-09-13 DIAGNOSIS — F900 Attention-deficit hyperactivity disorder, predominantly inattentive type: Secondary | ICD-10-CM | POA: Diagnosis not present

## 2014-09-13 DIAGNOSIS — M545 Low back pain: Secondary | ICD-10-CM | POA: Diagnosis not present

## 2014-09-13 DIAGNOSIS — M542 Cervicalgia: Secondary | ICD-10-CM | POA: Diagnosis not present

## 2014-09-13 DIAGNOSIS — G8929 Other chronic pain: Secondary | ICD-10-CM | POA: Diagnosis not present

## 2014-10-11 DIAGNOSIS — F900 Attention-deficit hyperactivity disorder, predominantly inattentive type: Secondary | ICD-10-CM | POA: Diagnosis not present

## 2014-10-11 DIAGNOSIS — G8929 Other chronic pain: Secondary | ICD-10-CM | POA: Diagnosis not present

## 2014-10-11 DIAGNOSIS — M542 Cervicalgia: Secondary | ICD-10-CM | POA: Diagnosis not present

## 2014-10-11 DIAGNOSIS — I10 Essential (primary) hypertension: Secondary | ICD-10-CM | POA: Diagnosis not present

## 2014-10-30 DIAGNOSIS — I10 Essential (primary) hypertension: Secondary | ICD-10-CM | POA: Diagnosis not present

## 2014-10-30 DIAGNOSIS — M542 Cervicalgia: Secondary | ICD-10-CM | POA: Diagnosis not present

## 2014-10-30 DIAGNOSIS — F900 Attention-deficit hyperactivity disorder, predominantly inattentive type: Secondary | ICD-10-CM | POA: Diagnosis not present

## 2014-10-30 DIAGNOSIS — G8929 Other chronic pain: Secondary | ICD-10-CM | POA: Diagnosis not present

## 2014-11-27 DIAGNOSIS — I10 Essential (primary) hypertension: Secondary | ICD-10-CM | POA: Diagnosis not present

## 2014-11-27 DIAGNOSIS — M542 Cervicalgia: Secondary | ICD-10-CM | POA: Diagnosis not present

## 2014-11-27 DIAGNOSIS — F900 Attention-deficit hyperactivity disorder, predominantly inattentive type: Secondary | ICD-10-CM | POA: Diagnosis not present

## 2014-11-27 DIAGNOSIS — G8929 Other chronic pain: Secondary | ICD-10-CM | POA: Diagnosis not present

## 2014-12-27 DIAGNOSIS — M542 Cervicalgia: Secondary | ICD-10-CM | POA: Diagnosis not present

## 2014-12-27 DIAGNOSIS — I10 Essential (primary) hypertension: Secondary | ICD-10-CM | POA: Diagnosis not present

## 2014-12-27 DIAGNOSIS — F900 Attention-deficit hyperactivity disorder, predominantly inattentive type: Secondary | ICD-10-CM | POA: Diagnosis not present

## 2014-12-27 DIAGNOSIS — G8929 Other chronic pain: Secondary | ICD-10-CM | POA: Diagnosis not present
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
## 2014-12-28 NOTE — Discharge Summary (Signed)
PATIENT NAME:  [name] MR#:  [id number] DATE OF BIRTH:  11-11-1960  DATE OF ADMISSION:  04/03/2013 DATE OF DISCHARGE:  04/04/2013  DISCHARGE DIAGNOSIS:  Syncope.   DISCHARGE MEDICATIONS:  There were no changes to his home medications. Per his home  med list:  1.  Aspirin 81 mg p.o. daily. 2.  Celexa 20 mg 1-1/2 tabs p.o. at bedtime.  3.  Metoprolol XL 50 mg p.o. daily.  4.  Trazodone 100 mg p.o. at bedtime. 5.  Ritalin 20 mg p.o. daily.   PERTINENT LABS AND STUDIES: The patient had a negative carotid Doppler study and negative CT of the head. Negative chest x-ray. Negative shoulder x-ray. Negative echo, showed an EF of 55% to 60%, normal global left ventricular function.  TSH 0.44, hemoglobin 13.3, white blood cell count 13.6, potassium prior to discharge was 4.1. Urine drug screen was positive for MDMA, opiates and also marijuana. Cardiac enzymes were negative. Magnesium 1.85   BRIEF HOSPITAL COURSE:  PROBLEM: Syncope. The patient had two syncopal episodes at home prior to admission. He was brought in for evaluation and had no further syncopal episodes here. His work-up was essentially negative. Upon arrival, urine drug screen was positive for opiates, for MDMA and also for marijuana. The patient had negative cardiac enzymes. EKG showed no acute changes. CT of the head was negative. Carotid studies and echoes were all within normal limits. Therefore, the patient was cleared for discharge. Unsure of etiology at this point. It could be medication induced. Reviewed medications prior to discharge and discharged home back on home medications. Warned about opiates and also warned about marijuana use. In addition to that asked him to follow up with Dr. Doy Hutching within 10 days. Smoking cessation was provided.   ____________________________ Dion Body, MD kl:dp D: 04/04/2013 08:15:23 ET T: 04/04/2013 08:30:56 ET JOB#: [id number]  cc: Dion Body, MD, <Dictator> Dion Body  MD ELECTRONICALLY SIGNED 04/24/2013 12:43

## 2015-01-25 DIAGNOSIS — G8929 Other chronic pain: Secondary | ICD-10-CM | POA: Diagnosis not present

## 2015-01-25 DIAGNOSIS — I10 Essential (primary) hypertension: Secondary | ICD-10-CM | POA: Diagnosis not present

## 2015-01-25 DIAGNOSIS — F900 Attention-deficit hyperactivity disorder, predominantly inattentive type: Secondary | ICD-10-CM | POA: Diagnosis not present

## 2015-01-25 DIAGNOSIS — M545 Low back pain: Secondary | ICD-10-CM | POA: Diagnosis not present

## 2015-01-25 DIAGNOSIS — F329 Major depressive disorder, single episode, unspecified: Secondary | ICD-10-CM | POA: Diagnosis not present

## 2015-01-25 DIAGNOSIS — M542 Cervicalgia: Secondary | ICD-10-CM | POA: Diagnosis not present

## 2015-01-29 ENCOUNTER — Ambulatory Visit
Admission: RE | Admit: 2015-01-29 | Discharge: 2015-01-29 | Disposition: A | Discharge status: Home / Self Care | Payer: Medicare Other | Source: Ambulatory Visit | Attending: Family Medicine | Admitting: Family Medicine

## 2015-01-29 ENCOUNTER — Other Ambulatory Visit: Payer: Self-pay | Admitting: Family Medicine

## 2015-01-29 DIAGNOSIS — R059 Cough, unspecified: Secondary | ICD-10-CM

## 2015-01-29 DIAGNOSIS — R05 Cough: Secondary | ICD-10-CM

## 2015-02-20 ENCOUNTER — Telehealth: Payer: Self-pay | Admitting: Family Medicine

## 2015-02-20 NOTE — Telephone Encounter (Signed)
Requesting a refill on Adderall and Oxycodone. He is not completely out but just calling in advance. [phone]

## 2015-02-20 NOTE — Telephone Encounter (Signed)
Appointment made for 02-21-15 @ 8a

## 2015-02-20 NOTE — Telephone Encounter (Signed)
This patient needs appt for medications.  He can come in tomorrow at 8:00 if no one took that spot.

## 2015-02-21 ENCOUNTER — Encounter: Payer: Self-pay | Admitting: Family Medicine

## 2015-02-21 ENCOUNTER — Ambulatory Visit (INDEPENDENT_AMBULATORY_CARE_PROVIDER_SITE_OTHER): Payer: Medicare Other | Admitting: Family Medicine

## 2015-02-21 VITALS — BP 128/80 | HR 75 | Resp 15 | Ht 72.0 in | Wt 232.0 lb

## 2015-02-21 DIAGNOSIS — F900 Attention-deficit hyperactivity disorder, predominantly inattentive type: Secondary | ICD-10-CM

## 2015-02-21 DIAGNOSIS — F988 Other specified behavioral and emotional disorders with onset usually occurring in childhood and adolescence: Secondary | ICD-10-CM

## 2015-02-21 DIAGNOSIS — M5412 Radiculopathy, cervical region: Secondary | ICD-10-CM | POA: Diagnosis not present

## 2015-02-21 MED ORDER — AMPHETAMINE-DEXTROAMPHET ER 20 MG PO CP24: 20.0000 mg | ORAL_CAPSULE | ORAL | Status: DC

## 2015-02-21 MED ORDER — OXYCODONE HCL 5 MG PO TABS: 5.0000 mg | ORAL_TABLET | Freq: Three times a day (TID) | ORAL | Status: DC | PRN
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
## 2015-03-21 ENCOUNTER — Ambulatory Visit (INDEPENDENT_AMBULATORY_CARE_PROVIDER_SITE_OTHER): Payer: Medicare Other | Admitting: Family Medicine

## 2015-03-21 ENCOUNTER — Encounter: Payer: Self-pay | Admitting: Family Medicine

## 2015-03-21 VITALS — BP 130/83 | HR 77 | Temp 98.7°F | Resp 17 | Wt 231.1 lb

## 2015-03-21 DIAGNOSIS — F988 Other specified behavioral and emotional disorders with onset usually occurring in childhood and adolescence: Secondary | ICD-10-CM

## 2015-03-21 DIAGNOSIS — I10 Essential (primary) hypertension: Secondary | ICD-10-CM

## 2015-03-21 DIAGNOSIS — F900 Attention-deficit hyperactivity disorder, predominantly inattentive type: Secondary | ICD-10-CM | POA: Diagnosis not present

## 2015-03-21 DIAGNOSIS — M5412 Radiculopathy, cervical region: Secondary | ICD-10-CM | POA: Diagnosis not present

## 2015-03-21 MED ORDER — AMPHETAMINE-DEXTROAMPHET ER 20 MG PO CP24: 20.0000 mg | ORAL_CAPSULE | ORAL | Status: DC

## 2015-03-21 MED ORDER — OXYCODONE HCL 5 MG PO TABS: 5.0000 mg | ORAL_TABLET | Freq: Three times a day (TID) | ORAL | Status: DC | PRN
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
## 2015-03-22 ENCOUNTER — Ambulatory Visit: Payer: Medicare Other | Admitting: Family Medicine

## 2015-04-03 ENCOUNTER — Telehealth: Payer: Self-pay

## 2015-04-03 NOTE — Telephone Encounter (Signed)
Tried to call patient to notify him that his Adderall was approved but, his phone number is either been changed or is invalid.

## 2015-04-22 ENCOUNTER — Ambulatory Visit (INDEPENDENT_AMBULATORY_CARE_PROVIDER_SITE_OTHER): Payer: Medicare Other | Admitting: Family Medicine

## 2015-04-22 ENCOUNTER — Encounter: Payer: Self-pay | Admitting: Family Medicine

## 2015-04-22 VITALS — BP 138/78 | HR 84 | Temp 98.5°F | Resp 18 | Ht 72.0 in | Wt 233.4 lb

## 2015-04-22 DIAGNOSIS — M5412 Radiculopathy, cervical region: Secondary | ICD-10-CM | POA: Diagnosis not present

## 2015-04-22 DIAGNOSIS — F900 Attention-deficit hyperactivity disorder, predominantly inattentive type: Secondary | ICD-10-CM

## 2015-04-22 DIAGNOSIS — F988 Other specified behavioral and emotional disorders with onset usually occurring in childhood and adolescence: Secondary | ICD-10-CM

## 2015-04-22 MED ORDER — AMPHETAMINE-DEXTROAMPHET ER 20 MG PO CP24: 20.0000 mg | ORAL_CAPSULE | ORAL | Status: DC

## 2015-04-22 MED ORDER — OXYCODONE HCL 5 MG PO TABS: 5.0000 mg | ORAL_TABLET | Freq: Three times a day (TID) | ORAL | Status: DC | PRN

## 2015-04-22 NOTE — Progress Notes (Signed)
Name: [name]   MRN: [id number]    DOB: 1961-08-24   Date:04/22/2015       Progress Note  Subjective  Chief Complaint  Chief Complaint  Patient presents with  . Follow-up    4 wk BP  . Hypertension  . Medication Refill    adderall XR 20mg  / oxyCODONE 5mg     Neck Pain  This is a chronic problem. The pain is associated with a remote injury. The pain is present in the midline. The pain is at a severity of 7/10. The symptoms are aggravated by bending and twisting. He has tried oral narcotics and neck support for the symptoms.  Attention Deficit Disorder Pt. has history of Attention Deficit Disorder, symptoms include difficulty focusing which is relieved with Adderall XR 20 mg daily.    Past Medical History  Diagnosis Date  . CAD (coronary artery disease) 8/08    cath, bare metal  . HTN (hypertension)   . Hyperlipidemia   . Liver laceration 9/09  . Splenic laceration 9/09  . Pneumothorax   . Pulmonary embolus 9/09  . DVT (deep venous thrombosis) 9/09    left leg found after ortho surgery  . 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 Rib fractures 9/09    x 3  . Hypogonadism male     Past Surgical History  Procedure Laterality Date  . Back surgery    . Coronary stent placement  05/2007    right main  . Fracture surgery  9/09    orif, screw, syndesmotic screw  . Thoracentesis  9/09    chest tube, ICU  . Splenic cautery  0/09  . Cardiac catheterization  8/08    90% focal R coronary, 80% R small obtuse marginal branch  . Appendectomy    . Cervical spine surgery  01/2009    Family History  Problem Relation Age of Onset  . Dementia Mother     Social History   Social History  . Marital Status: Married    Spouse Name: N/A  . Number of Children: N/A  . Years of Education: N/A   Occupational History  . Not on file.   Social History Main Topics  . Smoking status: Current Every Day Smoker -- 0.50 packs/day for 15 years    Types:  Cigarettes  . Smokeless tobacco: Never Used  . Alcohol Use: No  . Drug Use: No  . Sexual Activity: Not on file   Other Topics Concern  . Not on file   Social History Narrative     Current outpatient prescriptions:  .  amphetamine-dextroamphetamine (ADDERALL XR) 20 MG 24 hr capsule, Take 1 capsule (20 mg total) by mouth every morning., Disp: 30 capsule, Rfl: 0 .  aspirin 81 MG tablet, Take 81 mg by mouth daily., Disp: , Rfl:  .  citalopram (CELEXA) 20 MG tablet, Take 30 mg by mouth daily., Disp: , Rfl:  .  lisinopril (PRINIVIL,ZESTRIL) 10 MG tablet, Take 10 mg by mouth daily., Disp: , Rfl: 2 .  metoprolol succinate (TOPROL-XL) 50 MG 24 hr tablet, Take 50 mg by mouth daily. Take with or immediately following a meal., Disp: , Rfl:  .  oxyCODONE (OXY IR/ROXICODONE) 5 MG immediate release tablet, Take 1 tablet (5 mg total) by mouth 3 (three) times daily as needed for severe pain., Disp: 90 tablet, Rfl: 0  Allergies  Allergen Reactions  . No Known Allergies      Review of Systems  Musculoskeletal: Positive for neck pain.  Psychiatric/Behavioral: Positive for depression. The patient is nervous/anxious.       Objective  Filed Vitals:   04/22/15 0943  BP: 138/78  Pulse: 84  Temp: 98.5 F (36.9 C)  TempSrc: Oral  Resp: 18  Height: 6' (1.829 m)  Weight: 233 lb 6.4 oz (105.87 kg)  SpO2: 96%    Physical Exam  Constitutional: He is oriented to person, place, and time and well-developed, well-nourished, and in no distress.  Musculoskeletal:       Cervical back: He exhibits tenderness, pain and spasm.  Neurological: He is alert and oriented to person, place, and time.  Psychiatric: Affect and judgment normal.  Nursing note and vitals reviewed.    Assessment & Plan  1. Attention deficit disorder (ADD) without hyperactivity Symptoms stable on Adderall 20 mg every morning. No reported adverse effects. Refills provided and follow-up in one month. -  amphetamine-dextroamphetamine (ADDERALL XR) 20 MG 24 hr capsule; Take 1 capsule (20 mg total) by mouth every morning.  Dispense: 30 capsule; Refill: 0  2. Chronic cervical radiculopathy Pain responsive to opioid therapy. Patient is aware of the dependence potential and the interactions of opioids. Refills provided and follow-up in one month - oxyCODONE (OXY IR/ROXICODONE) 5 MG immediate release tablet; Take 1 tablet (5 mg total) by mouth 3 (three) times daily as needed for severe pain.  Dispense: 90 tablet; Refill: 0   Tynell Winchell [facility] 04/22/2015 9:54 AM

## 2015-04-23 ENCOUNTER — Ambulatory Visit: Payer: Medicare Other | Admitting: Family Medicine

## 2015-05-21 ENCOUNTER — Telehealth: Payer: Self-pay | Admitting: Family Medicine

## 2015-05-21 DIAGNOSIS — F988 Other specified behavioral and emotional disorders with onset usually occurring in childhood and adolescence: Secondary | ICD-10-CM

## 2015-05-21 DIAGNOSIS — M5412 Radiculopathy, cervical region: Secondary | ICD-10-CM

## 2015-05-21 MED ORDER — AMPHETAMINE-DEXTROAMPHET ER 20 MG PO CP24: 20.0000 mg | ORAL_CAPSULE | ORAL | Status: DC

## 2015-05-21 MED ORDER — OXYCODONE HCL 5 MG PO TABS: 5.0000 mg | ORAL_TABLET | Freq: Three times a day (TID) | ORAL | Status: DC | PRN

## 2015-05-21 NOTE — Telephone Encounter (Signed)
Prescription is ready for pick up patient has been informed to bring Photo ID

## 2015-05-22 ENCOUNTER — Other Ambulatory Visit: Payer: Self-pay | Admitting: Family Medicine

## 2015-05-22 DIAGNOSIS — F988 Other specified behavioral and emotional disorders with onset usually occurring in childhood and adolescence: Secondary | ICD-10-CM

## 2015-05-22 MED ORDER — AMPHETAMINE-DEXTROAMPHET ER 20 MG PO CP24: 20.0000 mg | ORAL_CAPSULE | ORAL | Status: DC

## 2015-05-24 ENCOUNTER — Ambulatory Visit: Payer: Medicare Other | Admitting: Family Medicine

## 2015-06-12 ENCOUNTER — Encounter: Payer: Self-pay | Admitting: Family Medicine

## 2015-06-12 ENCOUNTER — Ambulatory Visit (INDEPENDENT_AMBULATORY_CARE_PROVIDER_SITE_OTHER): Payer: Medicare Other | Admitting: Family Medicine

## 2015-06-12 VITALS — BP 140/80 | HR 121 | Temp 98.8°F | Resp 20 | Ht 72.0 in | Wt 234.5 lb

## 2015-06-12 DIAGNOSIS — F900 Attention-deficit hyperactivity disorder, predominantly inattentive type: Secondary | ICD-10-CM | POA: Diagnosis not present

## 2015-06-12 DIAGNOSIS — F988 Other specified behavioral and emotional disorders with onset usually occurring in childhood and adolescence: Secondary | ICD-10-CM

## 2015-06-12 DIAGNOSIS — M5412 Radiculopathy, cervical region: Secondary | ICD-10-CM | POA: Diagnosis not present

## 2015-06-12 MED ORDER — OXYCODONE HCL 5 MG PO TABS
5.0000 mg | ORAL_TABLET | Freq: Three times a day (TID) | ORAL | Status: DC | PRN
Start: 2015-06-12 — End: 2015-07-12

## 2015-06-12 MED ORDER — TIZANIDINE HCL 2 MG PO TABS: 2.0000 mg | ORAL_TABLET | Freq: Three times a day (TID) | ORAL | Status: DC | PRN

## 2015-06-12 MED ORDER — AMPHETAMINE-DEXTROAMPHET ER 20 MG PO CP24: 20.0000 mg | ORAL_CAPSULE | ORAL | Status: DC
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
## 2015-06-25 ENCOUNTER — Telehealth: Payer: Self-pay | Admitting: Family Medicine

## 2015-06-25 NOTE — Telephone Encounter (Signed)
Last Seen: 06-12-15 Next Appt: 07-13-15 Requesting refill on Citalopram, please send to [facility] [address] he is completely out.

## 2015-06-26 MED ORDER — CITALOPRAM HYDROBROMIDE 20 MG PO TABS: 30.0000 mg | ORAL_TABLET | Freq: Every day | ORAL | Status: DC

## 2015-06-26 NOTE — Telephone Encounter (Signed)
Medication has been refilled and sent to [facility] [address] 

## 2015-07-12 ENCOUNTER — Encounter: Payer: Self-pay | Admitting: Family Medicine

## 2015-07-12 ENCOUNTER — Ambulatory Visit (INDEPENDENT_AMBULATORY_CARE_PROVIDER_SITE_OTHER): Payer: Medicare Other | Admitting: Family Medicine

## 2015-07-12 VITALS — BP 138/80 | HR 75 | Temp 98.9°F | Resp 17 | Ht 72.0 in | Wt 237.9 lb

## 2015-07-12 DIAGNOSIS — F900 Attention-deficit hyperactivity disorder, predominantly inattentive type: Secondary | ICD-10-CM | POA: Diagnosis not present

## 2015-07-12 DIAGNOSIS — M5412 Radiculopathy, cervical region: Secondary | ICD-10-CM

## 2015-07-12 DIAGNOSIS — R062 Wheezing: Secondary | ICD-10-CM | POA: Diagnosis not present

## 2015-07-12 DIAGNOSIS — I10 Essential (primary) hypertension: Secondary | ICD-10-CM

## 2015-07-12 DIAGNOSIS — F988 Other specified behavioral and emotional disorders with onset usually occurring in childhood and adolescence: Secondary | ICD-10-CM

## 2015-07-12 MED ORDER — OXYCODONE HCL 5 MG PO TABS: 5.0000 mg | ORAL_TABLET | Freq: Three times a day (TID) | ORAL | Status: DC | PRN

## 2015-07-12 MED ORDER — AMPHETAMINE-DEXTROAMPHET ER 20 MG PO CP24: 20.0000 mg | ORAL_CAPSULE | ORAL | Status: DC
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
## 2015-07-22 ENCOUNTER — Other Ambulatory Visit: Payer: Self-pay | Admitting: Family Medicine

## 2015-08-12 ENCOUNTER — Ambulatory Visit (INDEPENDENT_AMBULATORY_CARE_PROVIDER_SITE_OTHER): Payer: Medicare Other | Admitting: Family Medicine

## 2015-08-12 ENCOUNTER — Encounter: Payer: Self-pay | Admitting: Family Medicine

## 2015-08-12 VITALS — BP 130/86 | HR 116 | Temp 98.8°F | Resp 14 | Ht 72.0 in | Wt 233.1 lb

## 2015-08-12 DIAGNOSIS — M5412 Radiculopathy, cervical region: Secondary | ICD-10-CM | POA: Diagnosis not present

## 2015-08-12 DIAGNOSIS — F900 Attention-deficit hyperactivity disorder, predominantly inattentive type: Secondary | ICD-10-CM

## 2015-08-12 DIAGNOSIS — F988 Other specified behavioral and emotional disorders with onset usually occurring in childhood and adolescence: Secondary | ICD-10-CM

## 2015-08-12 MED ORDER — OXYCODONE HCL 5 MG PO TABS: 5.0000 mg | ORAL_TABLET | Freq: Three times a day (TID) | ORAL | Status: DC | PRN

## 2015-08-12 MED ORDER — AMPHETAMINE-DEXTROAMPHET ER 20 MG PO CP24: 20.0000 mg | ORAL_CAPSULE | ORAL | Status: DC
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
## 2015-09-11 ENCOUNTER — Other Ambulatory Visit: Payer: Self-pay | Admitting: Family Medicine

## 2015-09-11 DIAGNOSIS — M5412 Radiculopathy, cervical region: Secondary | ICD-10-CM

## 2015-09-11 DIAGNOSIS — F988 Other specified behavioral and emotional disorders with onset usually occurring in childhood and adolescence: Secondary | ICD-10-CM

## 2015-09-11 NOTE — Telephone Encounter (Signed)
REFILLS ON ADDERAL AND PAIN MEDICATION. WILL PICK UP WHEN CALLED

## 2015-09-12 NOTE — Telephone Encounter (Signed)
Patient states Dr. Manuella Ghazi told him to come back in march for office visit and to call for refill of adderal and pain meds, routed to Dr. Manuella Ghazi for approval

## 2015-09-13 ENCOUNTER — Telehealth: Payer: Self-pay | Admitting: Family Medicine

## 2015-09-13 ENCOUNTER — Other Ambulatory Visit: Payer: Self-pay | Admitting: Family Medicine

## 2015-09-13 DIAGNOSIS — M5412 Radiculopathy, cervical region: Secondary | ICD-10-CM

## 2015-09-13 MED ORDER — OXYCODONE HCL 5 MG PO TABS: 5.0000 mg | ORAL_TABLET | Freq: Three times a day (TID) | ORAL | Status: DC | PRN

## 2015-09-13 MED ORDER — AMPHETAMINE-DEXTROAMPHET ER 20 MG PO CP24: 20.0000 mg | ORAL_CAPSULE | ORAL | Status: DC

## 2015-09-13 NOTE — Telephone Encounter (Signed)
Prescription for Oxycodone has been printed and patient has already picked up in office with photo ID on 09/13/2015

## 2015-09-13 NOTE — Telephone Encounter (Signed)
PT SAID THAT YOU REFILLED HIS ADDERAL BUT YOU DID NOT REFILL HIS PAIN MEDICATIONS. PATIENT IS VERY UPSET WITH THE DR FOR HE SAID THAT THE DR TOLD HIM THAT HE DID NOT NEED TO COME IN EVERY MONTH JUST CALL AND HE COULD REFILL HIS PAIN MEDS FOR HIM.

## 2015-09-23 ENCOUNTER — Telehealth: Payer: Self-pay | Admitting: Family Medicine

## 2015-09-23 DIAGNOSIS — M5412 Radiculopathy, cervical region: Secondary | ICD-10-CM

## 2015-09-23 NOTE — Telephone Encounter (Signed)
Routed to Dr. Shah for advice  

## 2015-09-23 NOTE — Telephone Encounter (Signed)
Pt states he was put on 10 day supply of 5 mg oxycodone and pt would like to know if it will be changed or if he needs to pick up a new RX. Please advise.

## 2015-09-24 MED ORDER — OXYCODONE HCL 5 MG PO TABS: 5.0000 mg | ORAL_TABLET | Freq: Three times a day (TID) | ORAL | Status: DC | PRN

## 2015-09-24 NOTE — Telephone Encounter (Signed)
Rx for oxycodone and is ready for pickup. Patient will need an appointment in one month. Please schedule.

## 2015-10-01 ENCOUNTER — Ambulatory Visit (INDEPENDENT_AMBULATORY_CARE_PROVIDER_SITE_OTHER): Payer: Medicare Other | Admitting: Family Medicine

## 2015-10-01 ENCOUNTER — Encounter: Payer: Self-pay | Admitting: Family Medicine

## 2015-10-01 VITALS — BP 130/78 | HR 100 | Temp 98.6°F | Resp 20 | Ht 72.0 in | Wt 232.3 lb

## 2015-10-01 DIAGNOSIS — M5412 Radiculopathy, cervical region: Secondary | ICD-10-CM | POA: Diagnosis not present
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
## 2015-10-16 ENCOUNTER — Encounter: Payer: Self-pay | Admitting: Family Medicine

## 2015-10-16 ENCOUNTER — Ambulatory Visit (INDEPENDENT_AMBULATORY_CARE_PROVIDER_SITE_OTHER): Payer: Medicare Other | Admitting: Family Medicine

## 2015-10-16 VITALS — BP 130/78 | HR 108 | Temp 98.5°F | Resp 20 | Ht 72.0 in | Wt 230.4 lb

## 2015-10-16 DIAGNOSIS — F900 Attention-deficit hyperactivity disorder, predominantly inattentive type: Secondary | ICD-10-CM

## 2015-10-16 DIAGNOSIS — F329 Major depressive disorder, single episode, unspecified: Secondary | ICD-10-CM

## 2015-10-16 DIAGNOSIS — F988 Other specified behavioral and emotional disorders with onset usually occurring in childhood and adolescence: Secondary | ICD-10-CM

## 2015-10-16 DIAGNOSIS — M5412 Radiculopathy, cervical region: Secondary | ICD-10-CM

## 2015-10-16 DIAGNOSIS — F32A Depression, unspecified: Secondary | ICD-10-CM

## 2015-10-16 MED ORDER — OXYCODONE HCL 5 MG PO TABS: 5.0000 mg | ORAL_TABLET | Freq: Three times a day (TID) | ORAL | Status: DC | PRN

## 2015-10-16 MED ORDER — CITALOPRAM HYDROBROMIDE 20 MG PO TABS: 20.0000 mg | ORAL_TABLET | Freq: Every day | ORAL | Status: DC

## 2015-10-16 MED ORDER — AMPHETAMINE-DEXTROAMPHET ER 20 MG PO CP24: 20.0000 mg | ORAL_CAPSULE | ORAL | Status: DC
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
## 2015-11-11 ENCOUNTER — Ambulatory Visit: Payer: Medicare Other | Admitting: Family Medicine

## 2015-11-18 ENCOUNTER — Ambulatory Visit: Payer: Medicare Other | Admitting: Family Medicine

## 2015-11-18 ENCOUNTER — Encounter: Payer: Self-pay | Admitting: Family Medicine

## 2015-11-18 ENCOUNTER — Ambulatory Visit (INDEPENDENT_AMBULATORY_CARE_PROVIDER_SITE_OTHER): Payer: Medicare Other | Admitting: Family Medicine

## 2015-11-18 VITALS — BP 130/80 | HR 106 | Temp 98.4°F | Resp 18 | Ht 72.0 in | Wt 232.8 lb

## 2015-11-18 DIAGNOSIS — F900 Attention-deficit hyperactivity disorder, predominantly inattentive type: Secondary | ICD-10-CM

## 2015-11-18 DIAGNOSIS — M5412 Radiculopathy, cervical region: Secondary | ICD-10-CM | POA: Diagnosis not present

## 2015-11-18 DIAGNOSIS — F988 Other specified behavioral and emotional disorders with onset usually occurring in childhood and adolescence: Secondary | ICD-10-CM

## 2015-11-18 MED ORDER — OXYCODONE HCL 5 MG PO TABS: 5.0000 mg | ORAL_TABLET | Freq: Three times a day (TID) | ORAL | Status: DC | PRN

## 2015-11-18 MED ORDER — AMPHETAMINE-DEXTROAMPHET ER 20 MG PO CP24: 20.0000 mg | ORAL_CAPSULE | ORAL | Status: DC

## 2015-11-18 MED ORDER — TIZANIDINE HCL 2 MG PO TABS: 2.0000 mg | ORAL_TABLET | Freq: Three times a day (TID) | ORAL | Status: DC | PRN
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
## 2015-12-11 ENCOUNTER — Encounter: Payer: Self-pay | Admitting: Family Medicine

## 2015-12-11 ENCOUNTER — Ambulatory Visit (INDEPENDENT_AMBULATORY_CARE_PROVIDER_SITE_OTHER): Payer: Medicare Other | Admitting: Family Medicine

## 2015-12-11 VITALS — BP 130/71 | HR 96 | Temp 98.5°F | Resp 18 | Ht 72.0 in | Wt 227.0 lb

## 2015-12-11 DIAGNOSIS — F900 Attention-deficit hyperactivity disorder, predominantly inattentive type: Secondary | ICD-10-CM

## 2015-12-11 DIAGNOSIS — M5412 Radiculopathy, cervical region: Secondary | ICD-10-CM | POA: Diagnosis not present

## 2015-12-11 DIAGNOSIS — F988 Other specified behavioral and emotional disorders with onset usually occurring in childhood and adolescence: Secondary | ICD-10-CM

## 2015-12-11 MED ORDER — AMPHETAMINE-DEXTROAMPHET ER 20 MG PO CP24: 20.0000 mg | ORAL_CAPSULE | ORAL | Status: DC

## 2015-12-11 MED ORDER — OXYCODONE HCL 5 MG PO TABS: 5.0000 mg | ORAL_TABLET | Freq: Three times a day (TID) | ORAL | Status: DC | PRN
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
## 2015-12-25 ENCOUNTER — Encounter: Payer: Medicare Other | Admitting: Family Medicine

## 2016-01-07 ENCOUNTER — Ambulatory Visit (INDEPENDENT_AMBULATORY_CARE_PROVIDER_SITE_OTHER): Payer: Medicare Other | Admitting: Family Medicine

## 2016-01-07 ENCOUNTER — Encounter: Payer: Self-pay | Admitting: Family Medicine

## 2016-01-07 VITALS — BP 128/72 | HR 92 | Temp 98.7°F | Resp 18 | Ht 72.0 in | Wt 221.1 lb

## 2016-01-07 DIAGNOSIS — Z1211 Encounter for screening for malignant neoplasm of colon: Secondary | ICD-10-CM | POA: Diagnosis not present

## 2016-01-07 DIAGNOSIS — Z0000 Encounter for general adult medical examination without abnormal findings: Secondary | ICD-10-CM | POA: Diagnosis not present

## 2016-01-07 NOTE — Progress Notes (Signed)
Name: [name]   MRN: [id number]    DOB: [birth year]   Date:01/07/2016       Progress Note  Subjective  Chief Complaint  Chief Complaint  Patient presents with  . Annual Exam    HPI  Pt. Is here for a Complete Physical Exam. Last Colonoscopy 7-8 years ago.  Past Medical History  Diagnosis Date  . CAD (coronary artery disease) 8/08    cath, bare metal  . HTN (hypertension)   . Hyperlipidemia   . Liver laceration 9/09  . Splenic laceration 9/09  . Pneumothorax   . Pulmonary embolus ([place]) 9/09  . DVT (deep venous thrombosis) ([place]) 9/09    left leg found after ortho surgery  . 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 Rib fractures 9/09    x 3  . Hypogonadism male     Past Surgical History  Procedure Laterality Date  . Back surgery    . Coronary stent placement  05/2007    right main  . Fracture surgery  9/09    orif, screw, syndesmotic screw  . Thoracentesis  9/09    chest tube, ICU  . Splenic cautery  0/09  . Cardiac catheterization  8/08    90% focal R coronary, 80% R small obtuse marginal branch  . Appendectomy    . Cervical spine surgery  01/2009    Family History  Problem Relation Age of Onset  . Dementia Mother     Social History   Social History  . Marital Status: Married    Spouse Name: N/A  . Number of Children: N/A  . Years of Education: N/A   Occupational History  . Not on file.   Social History Main Topics  . Smoking status: Current Every Day Smoker -- 0.50 packs/day for 15 years    Types: Cigarettes  . Smokeless tobacco: Never Used  . Alcohol Use: No  . Drug Use: No  . Sexual Activity: Not on file   Other Topics Concern  . Not on file   Social History Narrative     Current outpatient prescriptions:  .  amphetamine-dextroamphetamine (ADDERALL XR) 20 MG 24 hr capsule, Take 1 capsule (20 mg total) by mouth every morning., Disp: 30 capsule, Rfl: 0 .  aspirin 81 MG tablet, Take 81 mg by mouth daily., Disp:  , Rfl:  .  citalopram (CELEXA) 20 MG tablet, Take 1 tablet (20 mg total) by mouth daily., Disp: 90 tablet, Rfl: 0 .  metoprolol succinate (TOPROL-XL) 100 MG 24 hr tablet, Take 100 mg by mouth daily., Disp: , Rfl: 2 .  oxyCODONE (OXY IR/ROXICODONE) 5 MG immediate release tablet, Take 1 tablet (5 mg total) by mouth 3 (three) times daily as needed for severe pain., Disp: 90 tablet, Rfl: 0 .  tiZANidine (ZANAFLEX) 2 MG tablet, Take 1 tablet (2 mg total) by mouth every 8 (eight) hours as needed for muscle spasms., Disp: 30 tablet, Rfl: 0  Allergies  Allergen Reactions  . No Known Allergies      Review of Systems  Constitutional: Positive for malaise/fatigue. Negative for fever, chills and weight loss.  HENT: Negative for congestion and sore throat.   Eyes: Negative for blurred vision.  Respiratory: Negative for cough and shortness of breath.   Cardiovascular: Negative for chest pain and leg swelling.  Gastrointestinal: Negative for heartburn, nausea, vomiting, abdominal pain, diarrhea, constipation and blood in stool.  Genitourinary: Positive for frequency. Negative for dysuria and hematuria.  Musculoskeletal: Positive for back pain and neck pain.  Neurological: Negative for dizziness, seizures and headaches.  Psychiatric/Behavioral: Positive for depression. The patient is nervous/anxious.     Objective  Filed Vitals:   01/07/16 0903  BP: 128/72  Pulse: 92  Temp: 98.7 F (37.1 C)  TempSrc: Oral  Resp: 18  Height: 6' (1.829 m)  Weight: 221 lb 1.6 oz (100.29 kg)  SpO2: 98%    Physical Exam  Constitutional: He is oriented to person, place, and time and well-developed, well-nourished, and in no distress.  HENT:  Head: Normocephalic and atraumatic.  Eyes: Pupils are equal, round, and reactive to light.  Cardiovascular: Normal rate and regular rhythm.   Pulmonary/Chest: Effort normal and breath sounds normal.  Abdominal: Soft. Bowel sounds are normal.  Genitourinary: Prostate  normal. Rectal exam shows external hemorrhoid. Prostate is not tender.  Musculoskeletal:       Cervical back: He exhibits tenderness, pain and spasm.       Lumbar back: He exhibits tenderness, pain and spasm.       Back:  Neurological: He is alert and oriented to person, place, and time.  Psychiatric: Mood, memory, affect and judgment normal.  Nursing note and vitals reviewed.     Assessment & Plan  1. Annual physical exam  Pap pain age appropriate laboratory screenings, lab work not covered by insurance according to EMR. Printed ABN and patient will check with his insurance about coverage.  - CBC with Differential - Comprehensive Metabolic Panel (CMET) - Lipid Profile - TSH - Vitamin D (25 hydroxy) - PSA - EKG 12-Lead - HgB A1c  2. Screening for colon cancer  - Ambulatory referral to Gastroenterology   Dossie Der Asad A. [facility] 01/07/2016 9:08 AM

## 2016-01-09 ENCOUNTER — Encounter: Payer: Self-pay | Admitting: Family Medicine

## 2016-01-13 ENCOUNTER — Ambulatory Visit (INDEPENDENT_AMBULATORY_CARE_PROVIDER_SITE_OTHER): Payer: Medicare Other | Admitting: Family Medicine

## 2016-01-13 ENCOUNTER — Encounter: Payer: Self-pay | Admitting: Family Medicine

## 2016-01-13 VITALS — BP 140/84 | HR 107 | Temp 98.0°F | Resp 18 | Ht 72.0 in | Wt 224.8 lb

## 2016-01-13 DIAGNOSIS — F329 Major depressive disorder, single episode, unspecified: Secondary | ICD-10-CM

## 2016-01-13 DIAGNOSIS — F900 Attention-deficit hyperactivity disorder, predominantly inattentive type: Secondary | ICD-10-CM | POA: Diagnosis not present

## 2016-01-13 DIAGNOSIS — F988 Other specified behavioral and emotional disorders with onset usually occurring in childhood and adolescence: Secondary | ICD-10-CM

## 2016-01-13 DIAGNOSIS — F32A Depression, unspecified: Secondary | ICD-10-CM

## 2016-01-13 DIAGNOSIS — M5412 Radiculopathy, cervical region: Secondary | ICD-10-CM | POA: Diagnosis not present

## 2016-01-13 MED ORDER — CITALOPRAM HYDROBROMIDE 20 MG PO TABS: 20.0000 mg | ORAL_TABLET | Freq: Every day | ORAL | Status: DC

## 2016-01-13 MED ORDER — OXYCODONE HCL 5 MG PO TABS: 5.0000 mg | ORAL_TABLET | Freq: Three times a day (TID) | ORAL | Status: DC | PRN

## 2016-01-13 MED ORDER — AMPHETAMINE-DEXTROAMPHET ER 20 MG PO CP24: 20.0000 mg | ORAL_CAPSULE | ORAL | Status: DC
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
## 2016-02-12 ENCOUNTER — Ambulatory Visit (INDEPENDENT_AMBULATORY_CARE_PROVIDER_SITE_OTHER): Payer: Medicare Other | Admitting: Family Medicine

## 2016-02-12 ENCOUNTER — Encounter: Payer: Self-pay | Admitting: Family Medicine

## 2016-02-12 VITALS — BP 136/83 | HR 90 | Temp 97.9°F | Resp 18 | Ht 72.0 in | Wt 224.2 lb

## 2016-02-12 DIAGNOSIS — R062 Wheezing: Secondary | ICD-10-CM | POA: Diagnosis not present

## 2016-02-12 DIAGNOSIS — F988 Other specified behavioral and emotional disorders with onset usually occurring in childhood and adolescence: Secondary | ICD-10-CM

## 2016-02-12 DIAGNOSIS — F900 Attention-deficit hyperactivity disorder, predominantly inattentive type: Secondary | ICD-10-CM | POA: Diagnosis not present

## 2016-02-12 DIAGNOSIS — M5412 Radiculopathy, cervical region: Secondary | ICD-10-CM | POA: Diagnosis not present

## 2016-02-12 MED ORDER — ALBUTEROL SULFATE HFA 108 (90 BASE) MCG/ACT IN AERS: 2.0000 | INHALATION_SPRAY | Freq: Four times a day (QID) | RESPIRATORY_TRACT | Status: DC | PRN

## 2016-02-12 MED ORDER — OXYCODONE HCL 5 MG PO TABS: 5.0000 mg | ORAL_TABLET | Freq: Three times a day (TID) | ORAL | Status: DC | PRN

## 2016-02-12 MED ORDER — AMPHETAMINE-DEXTROAMPHET ER 20 MG PO CP24: 20.0000 mg | ORAL_CAPSULE | ORAL | Status: DC
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
## 2016-02-13 ENCOUNTER — Ambulatory Visit: Payer: Medicare Other | Admitting: Family Medicine

## 2016-03-09 ENCOUNTER — Encounter: Payer: Self-pay | Admitting: Family Medicine

## 2016-03-09 ENCOUNTER — Ambulatory Visit (INDEPENDENT_AMBULATORY_CARE_PROVIDER_SITE_OTHER): Payer: Medicare Other | Admitting: Family Medicine

## 2016-03-09 VITALS — BP 139/73 | HR 87 | Temp 97.7°F | Resp 17 | Ht 72.0 in | Wt 219.9 lb

## 2016-03-09 DIAGNOSIS — F900 Attention-deficit hyperactivity disorder, predominantly inattentive type: Secondary | ICD-10-CM | POA: Diagnosis not present

## 2016-03-09 DIAGNOSIS — M5412 Radiculopathy, cervical region: Secondary | ICD-10-CM

## 2016-03-09 DIAGNOSIS — F988 Other specified behavioral and emotional disorders with onset usually occurring in childhood and adolescence: Secondary | ICD-10-CM

## 2016-03-09 MED ORDER — OXYCODONE HCL 5 MG PO TABS: 5.0000 mg | ORAL_TABLET | Freq: Three times a day (TID) | ORAL | Status: DC | PRN

## 2016-03-09 MED ORDER — AMPHETAMINE-DEXTROAMPHET ER 20 MG PO CP24: 20.0000 mg | ORAL_CAPSULE | ORAL | Status: DC
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
## 2016-03-12 ENCOUNTER — Ambulatory Visit: Payer: Medicare Other | Admitting: Family Medicine

## 2016-04-02 ENCOUNTER — Ambulatory Visit (INDEPENDENT_AMBULATORY_CARE_PROVIDER_SITE_OTHER): Payer: Medicare Other | Admitting: Family Medicine

## 2016-04-02 ENCOUNTER — Encounter: Payer: Self-pay | Admitting: Family Medicine

## 2016-04-02 VITALS — BP 122/80 | HR 96 | Temp 98.6°F | Resp 18 | Ht 72.0 in | Wt 222.6 lb

## 2016-04-02 DIAGNOSIS — I10 Essential (primary) hypertension: Secondary | ICD-10-CM | POA: Diagnosis not present

## 2016-04-02 DIAGNOSIS — F988 Other specified behavioral and emotional disorders with onset usually occurring in childhood and adolescence: Secondary | ICD-10-CM

## 2016-04-02 DIAGNOSIS — F900 Attention-deficit hyperactivity disorder, predominantly inattentive type: Secondary | ICD-10-CM

## 2016-04-02 DIAGNOSIS — M5412 Radiculopathy, cervical region: Secondary | ICD-10-CM

## 2016-04-02 MED ORDER — OXYCODONE HCL 5 MG PO TABS: 5.0000 mg | ORAL_TABLET | Freq: Three times a day (TID) | ORAL | 0 refills | Status: DC | PRN

## 2016-04-02 MED ORDER — AMPHETAMINE-DEXTROAMPHET ER 20 MG PO CP24: 20.0000 mg | ORAL_CAPSULE | ORAL | 0 refills | Status: DC
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
## 2016-04-08 ENCOUNTER — Telehealth: Payer: Self-pay | Admitting: Family Medicine

## 2016-04-28 NOTE — Telephone Encounter (Signed)
Please close chart if complete. I am not able to. Thank you

## 2016-05-04 ENCOUNTER — Ambulatory Visit (INDEPENDENT_AMBULATORY_CARE_PROVIDER_SITE_OTHER): Payer: Medicare Other | Admitting: Family Medicine

## 2016-05-04 ENCOUNTER — Encounter: Payer: Self-pay | Admitting: Family Medicine

## 2016-05-04 DIAGNOSIS — F900 Attention-deficit hyperactivity disorder, predominantly inattentive type: Secondary | ICD-10-CM | POA: Diagnosis not present

## 2016-05-04 DIAGNOSIS — F329 Major depressive disorder, single episode, unspecified: Secondary | ICD-10-CM | POA: Diagnosis not present

## 2016-05-04 DIAGNOSIS — F32A Depression, unspecified: Secondary | ICD-10-CM

## 2016-05-04 DIAGNOSIS — F988 Other specified behavioral and emotional disorders with onset usually occurring in childhood and adolescence: Secondary | ICD-10-CM

## 2016-05-04 DIAGNOSIS — M5412 Radiculopathy, cervical region: Secondary | ICD-10-CM

## 2016-05-04 MED ORDER — OXYCODONE HCL 5 MG PO TABS: 5.0000 mg | ORAL_TABLET | Freq: Three times a day (TID) | ORAL | 0 refills | Status: DC | PRN

## 2016-05-04 MED ORDER — CITALOPRAM HYDROBROMIDE 20 MG PO TABS: 20.0000 mg | ORAL_TABLET | Freq: Every day | ORAL | 0 refills | Status: DC

## 2016-05-04 MED ORDER — AMPHETAMINE-DEXTROAMPHET ER 20 MG PO CP24: 20.0000 mg | ORAL_CAPSULE | ORAL | 0 refills | Status: DC
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
## 2016-06-03 ENCOUNTER — Encounter: Payer: Self-pay | Admitting: Family Medicine

## 2016-06-03 ENCOUNTER — Ambulatory Visit (INDEPENDENT_AMBULATORY_CARE_PROVIDER_SITE_OTHER): Payer: Medicare Other | Admitting: Family Medicine

## 2016-06-03 DIAGNOSIS — F900 Attention-deficit hyperactivity disorder, predominantly inattentive type: Secondary | ICD-10-CM | POA: Diagnosis not present

## 2016-06-03 DIAGNOSIS — M5412 Radiculopathy, cervical region: Secondary | ICD-10-CM

## 2016-06-03 DIAGNOSIS — F988 Other specified behavioral and emotional disorders with onset usually occurring in childhood and adolescence: Secondary | ICD-10-CM

## 2016-06-03 MED ORDER — OXYCODONE HCL 5 MG PO TABS: 5.0000 mg | ORAL_TABLET | Freq: Three times a day (TID) | ORAL | 0 refills | Status: DC | PRN

## 2016-06-03 MED ORDER — AMPHETAMINE-DEXTROAMPHET ER 20 MG PO CP24: 20.0000 mg | ORAL_CAPSULE | ORAL | 0 refills | Status: DC
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
## 2016-07-03 ENCOUNTER — Encounter: Payer: Self-pay | Admitting: Family Medicine

## 2016-07-03 ENCOUNTER — Ambulatory Visit (INDEPENDENT_AMBULATORY_CARE_PROVIDER_SITE_OTHER): Payer: Medicare Other | Admitting: Family Medicine

## 2016-07-03 VITALS — BP 140/80 | HR 92 | Temp 98.2°F | Resp 18 | Ht 72.0 in | Wt 222.1 lb

## 2016-07-03 DIAGNOSIS — F988 Other specified behavioral and emotional disorders with onset usually occurring in childhood and adolescence: Secondary | ICD-10-CM | POA: Diagnosis not present

## 2016-07-03 DIAGNOSIS — L723 Sebaceous cyst: Secondary | ICD-10-CM | POA: Diagnosis not present

## 2016-07-03 DIAGNOSIS — M5412 Radiculopathy, cervical region: Secondary | ICD-10-CM

## 2016-07-03 MED ORDER — AMPHETAMINE-DEXTROAMPHET ER 20 MG PO CP24: 20.0000 mg | ORAL_CAPSULE | ORAL | 0 refills | Status: DC

## 2016-07-03 MED ORDER — OXYCODONE HCL 5 MG PO TABS: 5.0000 mg | ORAL_TABLET | Freq: Three times a day (TID) | ORAL | 0 refills | Status: DC | PRN
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
## 2016-07-06 ENCOUNTER — Telehealth: Payer: Self-pay | Admitting: General Surgery

## 2016-07-06 NOTE — Telephone Encounter (Signed)
07-06-16@9 :01AM L/M FOR PT TO CALL & SCHEDULE AN APPOINTMENT FOR A SEBACEOUS CYST ON UPPER BACK.REF DR Dossie Der SH[facility]/MTH

## 2016-08-03 ENCOUNTER — Encounter: Payer: Self-pay | Admitting: Family Medicine

## 2016-08-03 ENCOUNTER — Ambulatory Visit (INDEPENDENT_AMBULATORY_CARE_PROVIDER_SITE_OTHER): Payer: Medicare Other | Admitting: Family Medicine

## 2016-08-03 DIAGNOSIS — M5412 Radiculopathy, cervical region: Secondary | ICD-10-CM | POA: Diagnosis not present

## 2016-08-03 DIAGNOSIS — F988 Other specified behavioral and emotional disorders with onset usually occurring in childhood and adolescence: Secondary | ICD-10-CM

## 2016-08-03 MED ORDER — AMPHETAMINE-DEXTROAMPHET ER 20 MG PO CP24: 20.0000 mg | ORAL_CAPSULE | ORAL | 0 refills | Status: DC

## 2016-08-03 MED ORDER — OXYCODONE HCL 5 MG PO TABS: 5.0000 mg | ORAL_TABLET | Freq: Three times a day (TID) | ORAL | 0 refills | Status: DC | PRN
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
## 2016-09-02 ENCOUNTER — Ambulatory Visit (INDEPENDENT_AMBULATORY_CARE_PROVIDER_SITE_OTHER): Payer: Medicare Other | Admitting: Family Medicine

## 2016-09-02 ENCOUNTER — Encounter: Payer: Self-pay | Admitting: Family Medicine

## 2016-09-02 DIAGNOSIS — M5412 Radiculopathy, cervical region: Secondary | ICD-10-CM | POA: Diagnosis not present

## 2016-09-02 DIAGNOSIS — F988 Other specified behavioral and emotional disorders with onset usually occurring in childhood and adolescence: Secondary | ICD-10-CM

## 2016-09-02 MED ORDER — AMPHETAMINE-DEXTROAMPHET ER 20 MG PO CP24: 20.0000 mg | ORAL_CAPSULE | ORAL | 0 refills | Status: DC

## 2016-09-02 MED ORDER — OXYCODONE HCL 5 MG PO TABS: 5.0000 mg | ORAL_TABLET | Freq: Three times a day (TID) | ORAL | 0 refills | Status: DC | PRN
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
## 2016-09-08 ENCOUNTER — Telehealth: Payer: Self-pay | Admitting: Family Medicine

## 2016-09-08 DIAGNOSIS — F329 Major depressive disorder, single episode, unspecified: Secondary | ICD-10-CM

## 2016-09-08 DIAGNOSIS — F32A Depression, unspecified: Secondary | ICD-10-CM

## 2016-09-08 NOTE — Telephone Encounter (Signed)
Pt needs refill on Citalopram to be sent to [facility].

## 2016-09-14 MED ORDER — CITALOPRAM HYDROBROMIDE 20 MG PO TABS: 20.0000 mg | ORAL_TABLET | Freq: Every day | ORAL | 0 refills | Status: DC

## 2016-09-14 NOTE — Telephone Encounter (Signed)
Medication has been refilled and sent to [place] [facility] 

## 2016-10-02 ENCOUNTER — Ambulatory Visit (INDEPENDENT_AMBULATORY_CARE_PROVIDER_SITE_OTHER): Payer: Medicare Other | Admitting: Family Medicine

## 2016-10-02 ENCOUNTER — Encounter: Payer: Self-pay | Admitting: Family Medicine

## 2016-10-02 DIAGNOSIS — E781 Pure hyperglyceridemia: Secondary | ICD-10-CM

## 2016-10-02 DIAGNOSIS — M5412 Radiculopathy, cervical region: Secondary | ICD-10-CM

## 2016-10-02 DIAGNOSIS — Z13220 Encounter for screening for lipoid disorders: Secondary | ICD-10-CM | POA: Insufficient documentation

## 2016-10-02 DIAGNOSIS — F988 Other specified behavioral and emotional disorders with onset usually occurring in childhood and adolescence: Secondary | ICD-10-CM | POA: Diagnosis not present

## 2016-10-02 MED ORDER — AMPHETAMINE-DEXTROAMPHET ER 20 MG PO CP24: 20.0000 mg | ORAL_CAPSULE | ORAL | 0 refills | Status: DC

## 2016-10-02 MED ORDER — OXYCODONE HCL 5 MG PO TABS: 5.0000 mg | ORAL_TABLET | Freq: Three times a day (TID) | ORAL | 0 refills | Status: DC | PRN
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
## 2016-11-02 ENCOUNTER — Ambulatory Visit (INDEPENDENT_AMBULATORY_CARE_PROVIDER_SITE_OTHER): Payer: Medicare Other | Admitting: Family Medicine

## 2016-11-02 ENCOUNTER — Encounter: Payer: Self-pay | Admitting: Family Medicine

## 2016-11-02 VITALS — BP 135/81 | HR 105 | Temp 97.9°F | Resp 17 | Ht 72.0 in | Wt 216.8 lb

## 2016-11-02 DIAGNOSIS — F988 Other specified behavioral and emotional disorders with onset usually occurring in childhood and adolescence: Secondary | ICD-10-CM

## 2016-11-02 DIAGNOSIS — I10 Essential (primary) hypertension: Secondary | ICD-10-CM

## 2016-11-02 DIAGNOSIS — M5412 Radiculopathy, cervical region: Secondary | ICD-10-CM

## 2016-11-02 MED ORDER — AMPHETAMINE-DEXTROAMPHET ER 20 MG PO CP24: 20.0000 mg | ORAL_CAPSULE | ORAL | 0 refills | Status: DC

## 2016-11-02 MED ORDER — METOPROLOL SUCCINATE ER 100 MG PO TB24: 100.0000 mg | ORAL_TABLET | Freq: Every day | ORAL | 0 refills | Status: DC

## 2016-11-02 MED ORDER — OXYCODONE HCL 5 MG PO TABS: 5.0000 mg | ORAL_TABLET | Freq: Three times a day (TID) | ORAL | 0 refills | Status: DC | PRN
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
## 2016-11-27 ENCOUNTER — Ambulatory Visit (INDEPENDENT_AMBULATORY_CARE_PROVIDER_SITE_OTHER): Payer: Medicare Other | Admitting: Family Medicine

## 2016-11-27 ENCOUNTER — Encounter: Payer: Self-pay | Admitting: Family Medicine

## 2016-11-27 DIAGNOSIS — M5412 Radiculopathy, cervical region: Secondary | ICD-10-CM | POA: Diagnosis not present

## 2016-11-27 DIAGNOSIS — F988 Other specified behavioral and emotional disorders with onset usually occurring in childhood and adolescence: Secondary | ICD-10-CM | POA: Diagnosis not present

## 2016-11-27 MED ORDER — AMPHETAMINE-DEXTROAMPHET ER 20 MG PO CP24: 20.0000 mg | ORAL_CAPSULE | ORAL | 0 refills | Status: DC

## 2016-11-27 MED ORDER — OXYCODONE HCL 5 MG PO TABS: 5.0000 mg | ORAL_TABLET | Freq: Three times a day (TID) | ORAL | 0 refills | Status: DC | PRN

## 2016-11-27 NOTE — Progress Notes (Signed)
Name: [name]   MRN: [id number]    DOB: 20-Apr-1961   Date:11/27/2016       Progress Note  Subjective  Chief Complaint  Chief Complaint  Patient presents with  . Medication Refill    1 month recheck    HPI  Attention Deficit Disorder: He is taking Adderall XR 20 mg every morning, seems to work with his symptoms of inattention, difficulty focusing, etc. No side effects reported.  Chronic Neck Pain: Pain is rated at 8/10 today, worse because he played with his grandkids outside yesterday. Takes Oxycodone 5 mg three times daily as needed. No side effects reported  Past Medical History:  Diagnosis Date  . CAD (coronary artery disease) 8/08   cath, bare metal  . DVT (deep venous thrombosis) ([place]) 9/09   left leg found after ortho surgery  . HTN (hypertension)   . Hyperlipidemia   . Hypogonadism male   . Liver laceration 9/09  . Pneumothorax   . Pulmonary embolus ([place]) 9/09  . 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 Rib fractures 9/09   x 3  . Splenic laceration 9/09    Past Surgical History:  Procedure Laterality Date  . APPENDECTOMY    . BACK SURGERY    . CARDIAC CATHETERIZATION  8/08   90% focal R coronary, 80% R small obtuse marginal branch  . CERVICAL SPINE SURGERY  01/2009  . CORONARY STENT PLACEMENT  05/2007   right main  . FRACTURE SURGERY  9/09   orif, screw, syndesmotic screw  . splenic cautery  0/09  . THORACENTESIS  9/09   chest tube, ICU    Family History  Problem Relation Age of Onset  . Dementia Mother     Social History   Social History  . Marital status: Married    Spouse name: N/A  . Number of children: N/A  . Years of education: N/A   Occupational History  . Not on file.   Social History Main Topics  . Smoking status: Current Every Day Smoker    Packs/day: 0.50    Years: 15.00    Types: Cigarettes  . Smokeless tobacco: Never Used  . Alcohol use No  . Drug use: No  . Sexual activity: Not on file    Other Topics Concern  . Not on file   Social History Narrative  . No narrative on file     Current Outpatient Prescriptions:  .  albuterol (PROVENTIL HFA;VENTOLIN HFA) 108 (90 Base) MCG/ACT inhaler, Inhale 2 puffs into the lungs every 6 (six) hours as needed for wheezing or shortness of breath., Disp: 1 Inhaler, Rfl: 0 .  amphetamine-dextroamphetamine (ADDERALL XR) 20 MG 24 hr capsule, Take 1 capsule (20 mg total) by mouth every morning., Disp: 30 capsule, Rfl: 0 .  aspirin 81 MG tablet, Take 81 mg by mouth daily., Disp: , Rfl:  .  citalopram (CELEXA) 20 MG tablet, Take 1 tablet (20 mg total) by mouth daily., Disp: 90 tablet, Rfl: 0 .  metoprolol succinate (TOPROL-XL) 100 MG 24 hr tablet, Take 1 tablet (100 mg total) by mouth daily., Disp: 90 tablet, Rfl: 0 .  oxyCODONE (OXY IR/ROXICODONE) 5 MG immediate release tablet, Take 1 tablet (5 mg total) by mouth 3 (three) times daily as needed for severe pain., Disp: 90 tablet, Rfl: 0 .  tiZANidine (ZANAFLEX) 2 MG tablet, Take 1 tablet (2 mg total) by mouth every 8 (eight) hours as needed for muscle spasms., Disp: 30  tablet, Rfl: 0  Allergies  Allergen Reactions  . No Known Allergies      ROS  Please see history of present illness for complete discussion of ROS  Objective  Vitals:   11/27/16 1006  BP: 132/76  Pulse: 60  Resp: 18  Temp: 98 F (36.7 C)  TempSrc: Oral  SpO2: 96%  Weight: 223 lb 12.8 oz (101.5 kg)  Height: 6' (1.829 m)    Physical Exam  Constitutional: He is oriented to person, place, and time and well-developed, well-nourished, and in no distress.  Cardiovascular: Normal rate, regular rhythm, S1 normal and S2 normal.   No murmur heard. Pulmonary/Chest: Breath sounds normal. He has no wheezes. He has no rhonchi.  Musculoskeletal:       Right ankle: He exhibits no swelling.       Left ankle: He exhibits no swelling.       Cervical back: He exhibits tenderness and pain. He exhibits no spasm.        Back:  Neurological: He is alert and oriented to person, place, and time.  Psychiatric: Mood, memory, affect and judgment normal.  Nursing note and vitals reviewed.     Assessment & Plan  1. Attention deficit disorder (ADD) without hyperactivity  Stable and responsive to Adderall taken the morning, refills provided - amphetamine-dextroamphetamine (ADDERALL XR) 20 MG 24 hr capsule; Take 1 capsule (20 mg total) by mouth every morning.  Dispense: 30 capsule; Refill: 0  2. Chronic cervical radiculopathy  Pain is controlled on oxycodone taken up to 3 times daily as needed, refills provided - oxyCODONE (OXY IR/ROXICODONE) 5 MG immediate release tablet; Take 1 tablet (5 mg total) by mouth 3 (three) times daily as needed for severe pain.  Dispense: 90 tablet; Refill: 0   [name] 11/27/2016 10:13 AM

## 2016-11-30 ENCOUNTER — Ambulatory Visit: Payer: Medicare Other | Admitting: Family Medicine

## 2016-12-23 ENCOUNTER — Encounter: Payer: Self-pay | Admitting: Family Medicine

## 2016-12-23 ENCOUNTER — Ambulatory Visit (INDEPENDENT_AMBULATORY_CARE_PROVIDER_SITE_OTHER): Payer: Medicare Other | Admitting: Family Medicine

## 2016-12-23 VITALS — BP 134/73 | HR 72 | Temp 97.9°F | Resp 16 | Ht 72.0 in | Wt 219.4 lb

## 2016-12-23 DIAGNOSIS — F988 Other specified behavioral and emotional disorders with onset usually occurring in childhood and adolescence: Secondary | ICD-10-CM | POA: Diagnosis not present

## 2016-12-23 DIAGNOSIS — F329 Major depressive disorder, single episode, unspecified: Secondary | ICD-10-CM | POA: Diagnosis not present

## 2016-12-23 DIAGNOSIS — F32A Depression, unspecified: Secondary | ICD-10-CM

## 2016-12-23 DIAGNOSIS — E782 Mixed hyperlipidemia: Secondary | ICD-10-CM

## 2016-12-23 DIAGNOSIS — M5412 Radiculopathy, cervical region: Secondary | ICD-10-CM

## 2016-12-23 LAB — LIPID PANEL
CHOL/HDL RATIO: 10.9 ratio — AB (ref <5.0)
CHOLESTEROL: 196 mg/dL (ref <200)
HDL: 18 mg/dL — ABNORMAL LOW (ref >40)
LDL Cholesterol: 123 mg/dL — ABNORMAL HIGH (ref <100)
TRIGLYCERIDES: 276 mg/dL — AB (ref <150)
VLDL: 55 mg/dL — ABNORMAL HIGH (ref <30)

## 2016-12-23 LAB — COMPLETE METABOLIC PANEL WITH GFR
ALBUMIN: 4.5 g/dL (ref 3.6–5.1)
ALK PHOS: 80 U/L (ref 40–115)
ALT: 18 U/L (ref 9–46)
AST: 17 U/L (ref 10–35)
BUN: 21 mg/dL (ref 7–25)
CALCIUM: 9.7 mg/dL (ref 8.6–10.3)
CO2: 26 mmol/L (ref 20–31)
CREATININE: 1.02 mg/dL (ref 0.70–1.33)
Chloride: 101 mmol/L (ref 98–110)
GFR, Est African American: >89 mL/min (ref >=60)
GFR, Est Non African American: 82 mL/min (ref >=60)
Glucose, Bld: 94 mg/dL (ref 65–99)
Potassium: 5 mmol/L (ref 3.5–5.3)
Sodium: 136 mmol/L (ref 135–146)
Total Bilirubin: 0.6 mg/dL (ref 0.2–1.2)
Total Protein: 7.3 g/dL (ref 6.1–8.1)

## 2016-12-23 MED ORDER — CITALOPRAM HYDROBROMIDE 20 MG PO TABS: 20.0000 mg | ORAL_TABLET | Freq: Every day | ORAL | 0 refills | Status: DC

## 2016-12-23 MED ORDER — OXYCODONE HCL 5 MG PO TABS: 5.0000 mg | ORAL_TABLET | Freq: Three times a day (TID) | ORAL | 0 refills | Status: DC | PRN

## 2016-12-23 MED ORDER — AMPHETAMINE-DEXTROAMPHET ER 20 MG PO CP24: 20.0000 mg | ORAL_CAPSULE | ORAL | 0 refills | Status: DC

## 2016-12-23 NOTE — Progress Notes (Signed)
Name: [name]   MRN: [id number]    DOB: [birth year]   Date:12/23/2016       Progress Note  Subjective  Chief Complaint  Chief Complaint  Patient presents with  . Follow-up    1 MO  . Medication Refill    Hyperlipidemia  This is a chronic problem. The problem is uncontrolled (Elevated Triglycerides). He is currently on no antihyperlipidemic treatment.  Depression         This is a chronic problem.  The onset quality is gradual. The problem is unchanged.  Associated symptoms include no fatigue, no helplessness, does not have insomnia, no restlessness, no decreased interest, no headaches and not sad.  Past treatments include SSRIs - Selective serotonin reuptake inhibitors.  Compliance with treatment is good.  Past medical history includes chronic pain.     Attention Deficit Disorder:  He is taking Adderall XR 20 mg every morning, seems to work with his symptoms of inattention, difficulty focusing, confusion with directions etc. No side effects reported.  Chronic Neck Pain:  Pain is rated at 7/10 today, worse because he worked in the yard yesterday. Takes Oxycodone 5 mg three times daily as needed, along with heating pad at night.  No side effects from opioids reported    Past Medical History:  Diagnosis Date  . CAD (coronary artery disease) 8/08   cath, bare metal  . DVT (deep venous thrombosis) ([place]) 9/09   left leg found after ortho surgery  . HTN (hypertension)   . Hyperlipidemia   . Hypogonadism male   . Liver laceration 9/09  . Pneumothorax   . Pulmonary embolus ([place]) 9/09  . 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 Rib fractures 9/09   x 3  . Splenic laceration 9/09    Past Surgical History:  Procedure Laterality Date  . APPENDECTOMY    . BACK SURGERY    . CARDIAC CATHETERIZATION  8/08   90% focal R coronary, 80% R small obtuse marginal branch  . CERVICAL SPINE SURGERY  01/2009  . CORONARY STENT PLACEMENT  05/2007   right main  .  FRACTURE SURGERY  9/09   orif, screw, syndesmotic screw  . splenic cautery  0/09  . THORACENTESIS  9/09   chest tube, ICU    Family History  Problem Relation Age of Onset  . Dementia Mother     Social History   Social History  . Marital status: Married    Spouse name: N/A  . Number of children: N/A  . Years of education: N/A   Occupational History  . Not on file.   Social History Main Topics  . Smoking status: Current Every Day Smoker    Packs/day: 0.50    Years: 15.00    Types: Cigarettes  . Smokeless tobacco: Never Used  . Alcohol use No  . Drug use: No  . Sexual activity: Not on file   Other Topics Concern  . Not on file   Social History Narrative  . No narrative on file     Current Outpatient Prescriptions:  .  albuterol (PROVENTIL HFA;VENTOLIN HFA) 108 (90 Base) MCG/ACT inhaler, Inhale 2 puffs into the lungs every 6 (six) hours as needed for wheezing or shortness of breath., Disp: 1 Inhaler, Rfl: 0 .  amphetamine-dextroamphetamine (ADDERALL XR) 20 MG 24 hr capsule, Take 1 capsule (20 mg total) by mouth every morning., Disp: 30 capsule, Rfl: 0 .  aspirin 81 MG tablet, Take 81 mg by  mouth daily., Disp: , Rfl:  .  citalopram (CELEXA) 20 MG tablet, Take 1 tablet (20 mg total) by mouth daily., Disp: 90 tablet, Rfl: 0 .  metoprolol succinate (TOPROL-XL) 100 MG 24 hr tablet, Take 1 tablet (100 mg total) by mouth daily., Disp: 90 tablet, Rfl: 0 .  oxyCODONE (OXY IR/ROXICODONE) 5 MG immediate release tablet, Take 1 tablet (5 mg total) by mouth 3 (three) times daily as needed for severe pain., Disp: 90 tablet, Rfl: 0 .  tiZANidine (ZANAFLEX) 2 MG tablet, Take 1 tablet (2 mg total) by mouth every 8 (eight) hours as needed for muscle spasms., Disp: 30 tablet, Rfl: 0  Allergies  Allergen Reactions  . No Known Allergies      Review of Systems  Constitutional: Negative for fatigue.  Neurological: Negative for headaches.  Psychiatric/Behavioral: Positive for  depression. The patient does not have insomnia.     Objective  Vitals:   12/23/16 0954  BP: 134/73  Pulse: 72  Resp: 16  Temp: 97.9 F (36.6 C)  TempSrc: Oral  SpO2: 96%  Weight: 219 lb 6.4 oz (99.5 kg)  Height: 6' (1.829 m)    Physical Exam  Constitutional: He is oriented to person, place, and time and well-developed, well-nourished, and in no distress.  Cardiovascular: Normal rate, regular rhythm, S1 normal and S2 normal.   No murmur heard. Pulmonary/Chest: Breath sounds normal. He has no wheezes. He has no rhonchi.  Abdominal: Soft. Bowel sounds are normal. There is no tenderness. There is no rebound.  Musculoskeletal:       Cervical back: He exhibits tenderness, pain and spasm.       Back:  Neurological: He is alert and oriented to person, place, and time.  Psychiatric: Mood, memory, affect and judgment normal.  Nursing note and vitals reviewed.    Assessment & Plan  1. Attention deficit disorder (ADD) without hyperactivity Stable, responsive to Adderall 20 mg taken every morning, refills provided - amphetamine-dextroamphetamine (ADDERALL XR) 20 MG 24 hr capsule; Take 1 capsule (20 mg total) by mouth every morning.  Dispense: 30 capsule; Refill: 0  2. Chronic cervical radiculopathy Stable, responsive to oxycodone 5 mg taken 3 times daily as needed, patient compliant with controlled substances agreement, refills provided - oxyCODONE (OXY IR/ROXICODONE) 5 MG immediate release tablet; Take 1 tablet (5 mg total) by mouth 3 (three) times daily as needed for severe pain.  Dispense: 90 tablet; Refill: 0  3. Depression, unspecified depression type  - citalopram (CELEXA) 20 MG tablet; Take 1 tablet (20 mg total) by mouth daily.  Dispense: 90 tablet; Refill: 0  4. Mixed hyperlipidemia  - Lipid panel - COMPLETE METABOLIC PANEL WITH GFR   Pola Furno [facility] 12/23/2016 9:56 AM

## 2017-01-08 ENCOUNTER — Encounter: Payer: Medicare Other | Admitting: Family Medicine

## 2017-01-22 ENCOUNTER — Ambulatory Visit (INDEPENDENT_AMBULATORY_CARE_PROVIDER_SITE_OTHER): Payer: Medicare Other | Admitting: Family Medicine

## 2017-01-22 ENCOUNTER — Encounter: Payer: Self-pay | Admitting: Family Medicine

## 2017-01-22 VITALS — BP 136/81 | HR 76 | Temp 98.5°F | Resp 16 | Ht 72.0 in | Wt 218.3 lb

## 2017-01-22 DIAGNOSIS — E782 Mixed hyperlipidemia: Secondary | ICD-10-CM | POA: Diagnosis not present

## 2017-01-22 DIAGNOSIS — F988 Other specified behavioral and emotional disorders with onset usually occurring in childhood and adolescence: Secondary | ICD-10-CM

## 2017-01-22 DIAGNOSIS — M5412 Radiculopathy, cervical region: Secondary | ICD-10-CM

## 2017-01-22 MED ORDER — ATORVASTATIN CALCIUM 20 MG PO TABS: 20.0000 mg | ORAL_TABLET | Freq: Every day | ORAL | 0 refills | Status: DC

## 2017-01-22 MED ORDER — AMPHETAMINE-DEXTROAMPHET ER 20 MG PO CP24: 20.0000 mg | ORAL_CAPSULE | ORAL | 0 refills | Status: DC

## 2017-01-22 MED ORDER — OXYCODONE HCL 5 MG PO TABS: 5.0000 mg | ORAL_TABLET | Freq: Three times a day (TID) | ORAL | 0 refills | Status: DC | PRN

## 2017-01-22 NOTE — Progress Notes (Signed)
Name: [name]   MRN: [id number]    DOB: June 04, 1961   Date:01/22/2017       Progress Note  Subjective  Chief Complaint  Chief Complaint  Patient presents with  . Follow-up    1 mo  . Medication Refill    Hyperlipidemia  This is a chronic problem. The problem is uncontrolled. Recent lipid tests were reviewed and are high. He is currently on no antihyperlipidemic treatment. Risk factors for coronary artery disease include dyslipidemia and male sex.    Attention Deficit Disorder:  He is taking Adderall XR 20 mg every morning, seems to work with his symptoms of inattention, difficulty focusing, confusion with directions etc. No side effects reported.  Chronic Neck Pain:  Pain is rated at 8/10 today, Takes Oxycodone 5 mg three times daily as needed, along with heating pad at night.  No side effects from opioids reported,  Past Medical History:  Diagnosis Date  . CAD (coronary artery disease) 8/08   cath, bare metal  . DVT (deep venous thrombosis) ([place]) 9/09   left leg found after ortho surgery  . HTN (hypertension)   . Hyperlipidemia   . Hypogonadism male   . Liver laceration 9/09  . Pneumothorax   . Pulmonary embolus ([place]) 9/09  . 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 Rib fractures 9/09   x 3  . Splenic laceration 9/09    Past Surgical History:  Procedure Laterality Date  . APPENDECTOMY    . BACK SURGERY    . CARDIAC CATHETERIZATION  8/08   90% focal R coronary, 80% R small obtuse marginal branch  . CERVICAL SPINE SURGERY  01/2009  . CORONARY STENT PLACEMENT  05/2007   right main  . FRACTURE SURGERY  9/09   orif, screw, syndesmotic screw  . splenic cautery  0/09  . THORACENTESIS  9/09   chest tube, ICU    Family History  Problem Relation Age of Onset  . Dementia Mother     Social History   Social History  . Marital status: Married    Spouse name: N/A  . Number of children: N/A  . Years of education: N/A   Occupational  History  . Not on file.   Social History Main Topics  . Smoking status: Current Every Day Smoker    Packs/day: 0.50    Years: 15.00    Types: Cigarettes  . Smokeless tobacco: Never Used  . Alcohol use No  . Drug use: No  . Sexual activity: Not on file   Other Topics Concern  . Not on file   Social History Narrative  . No narrative on file     Current Outpatient Prescriptions:  .  albuterol (PROVENTIL HFA;VENTOLIN HFA) 108 (90 Base) MCG/ACT inhaler, Inhale 2 puffs into the lungs every 6 (six) hours as needed for wheezing or shortness of breath., Disp: 1 Inhaler, Rfl: 0 .  amphetamine-dextroamphetamine (ADDERALL XR) 20 MG 24 hr capsule, Take 1 capsule (20 mg total) by mouth every morning., Disp: 30 capsule, Rfl: 0 .  aspirin 81 MG tablet, Take 81 mg by mouth daily., Disp: , Rfl:  .  citalopram (CELEXA) 20 MG tablet, Take 1 tablet (20 mg total) by mouth daily., Disp: 90 tablet, Rfl: 0 .  metoprolol succinate (TOPROL-XL) 100 MG 24 hr tablet, Take 1 tablet (100 mg total) by mouth daily., Disp: 90 tablet, Rfl: 0 .  oxyCODONE (OXY IR/ROXICODONE) 5 MG immediate release tablet, Take 1 tablet (  5 mg total) by mouth 3 (three) times daily as needed for severe pain., Disp: 90 tablet, Rfl: 0 .  tiZANidine (ZANAFLEX) 2 MG tablet, Take 1 tablet (2 mg total) by mouth every 8 (eight) hours as needed for muscle spasms., Disp: 30 tablet, Rfl: 0  Allergies  Allergen Reactions  . No Known Allergies      ROS  Please see history of present illness for complete discussion of ROS  Objective  Vitals:   01/22/17 0846  BP: 136/81  Pulse: 76  Resp: 16  Temp: 98.5 F (36.9 C)  TempSrc: Oral  SpO2: 98%  Weight: 218 lb 4.8 oz (99 kg)  Height: 6' (1.829 m)    Physical Exam  Constitutional: He is oriented to person, place, and time and well-developed, well-nourished, and in no distress.  HENT:  Head: Normocephalic and atraumatic.  Cardiovascular: Normal rate, regular rhythm, S1 normal and S2  normal.   No murmur heard. Pulmonary/Chest: Breath sounds normal. He has no wheezes. He has no rhonchi.  Abdominal: Soft. Bowel sounds are normal. There is no tenderness. There is no rebound.  Musculoskeletal:       Cervical back: He exhibits tenderness, pain and spasm.       Back:  Neurological: He is alert and oriented to person, place, and time.  Psychiatric: Mood, memory, affect and judgment normal.  Nursing note and vitals reviewed.   Assessment & Plan  1. Attention deficit disorder (ADD) without hyperactivity Stable, responsive to Adderall taken - amphetamine-dextroamphetamine (ADDERALL XR) 20 MG 24 hr capsule; Take 1 capsule (20 mg total) by mouth every morning.  Dispense: 30 capsule; Refill: 0  2. Chronic cervical radiculopathy Pain responsive to opioid therapy, patient compliant with controlled substances agreement, refills provided - oxyCODONE (OXY IR/ROXICODONE) 5 MG immediate release tablet; Take 1 tablet (5 mg total) by mouth 3 (three) times daily as needed for severe pain.  Dispense: 90 tablet; Refill: 0  3. Mixed hyperlipidemia Reviewed FLP from April 2018, start on atorvastatin 20 mg at bedtime and recheck FLP in 3 months - atorvastatin (LIPITOR) 20 MG tablet; Take 1 tablet (20 mg total) by mouth daily.  Dispense: 90 tablet; Refill: 0   Rossie Bretado [facility] 01/22/2017 8:56 AM

## 2017-02-22 ENCOUNTER — Ambulatory Visit (INDEPENDENT_AMBULATORY_CARE_PROVIDER_SITE_OTHER): Payer: Medicare Other | Admitting: Family Medicine

## 2017-02-22 ENCOUNTER — Encounter: Payer: Self-pay | Admitting: Family Medicine

## 2017-02-22 VITALS — BP 132/80 | HR 110 | Temp 98.0°F | Resp 17 | Ht 72.0 in | Wt 221.1 lb

## 2017-02-22 DIAGNOSIS — M5412 Radiculopathy, cervical region: Secondary | ICD-10-CM

## 2017-02-22 DIAGNOSIS — E782 Mixed hyperlipidemia: Secondary | ICD-10-CM | POA: Diagnosis not present

## 2017-02-22 DIAGNOSIS — F988 Other specified behavioral and emotional disorders with onset usually occurring in childhood and adolescence: Secondary | ICD-10-CM | POA: Diagnosis not present

## 2017-02-22 MED ORDER — AMPHETAMINE-DEXTROAMPHET ER 20 MG PO CP24: 20.0000 mg | ORAL_CAPSULE | ORAL | 0 refills | Status: DC

## 2017-02-22 MED ORDER — OXYCODONE HCL 5 MG PO TABS: 5.0000 mg | ORAL_TABLET | Freq: Three times a day (TID) | ORAL | 0 refills | Status: DC | PRN

## 2017-02-22 NOTE — Progress Notes (Signed)
Name: [name]   MRN: [id number]    DOB: June 16, 1961   Date:02/22/2017       Progress Note  Subjective  Chief Complaint  Chief Complaint  Patient presents with  . Follow-up    1 mo  . Medication Refill    HPI  Attention Deficit Disorder: He is taking Adderall XR 20 mg every morning for Attention Deficit Disorder,  seems to work with his symptoms of inattention, difficulty focusing, confusion with directionsetc. No side effects reported.  Chronic Neck Pain: Pain is rated at 7/10 today, Takes Oxycodone 5 mg three times daily as needed, along with heating pad at night and TENS unit for added relief. No side effects from opioids reported,   Past Medical History:  Diagnosis Date  . CAD (coronary artery disease) 8/08   cath, bare metal  . DVT (deep venous thrombosis) ([place]) 9/09   left leg found after ortho surgery  . HTN (hypertension)   . Hyperlipidemia   . Hypogonadism male   . Liver laceration 9/09  . Pneumothorax   . Pulmonary embolus ([place]) 9/09  . 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 Rib fractures 9/09   x 3  . Splenic laceration 9/09    Past Surgical History:  Procedure Laterality Date  . APPENDECTOMY    . BACK SURGERY    . CARDIAC CATHETERIZATION  8/08   90% focal R coronary, 80% R small obtuse marginal branch  . CERVICAL SPINE SURGERY  01/2009  . CORONARY STENT PLACEMENT  05/2007   right main  . FRACTURE SURGERY  9/09   orif, screw, syndesmotic screw  . splenic cautery  0/09  . THORACENTESIS  9/09   chest tube, ICU    Family History  Problem Relation Age of Onset  . Dementia Mother     Social History   Social History  . Marital status: Married    Spouse name: N/A  . Number of children: N/A  . Years of education: N/A   Occupational History  . Not on file.   Social History Main Topics  . Smoking status: Current Every Day Smoker    Packs/day: 0.50    Years: 15.00    Types: Cigarettes  . Smokeless tobacco:  Never Used  . Alcohol use No  . Drug use: No  . Sexual activity: Not on file   Other Topics Concern  . Not on file   Social History Narrative  . No narrative on file     Current Outpatient Prescriptions:  .  albuterol (PROVENTIL HFA;VENTOLIN HFA) 108 (90 Base) MCG/ACT inhaler, Inhale 2 puffs into the lungs every 6 (six) hours as needed for wheezing or shortness of breath., Disp: 1 Inhaler, Rfl: 0 .  amphetamine-dextroamphetamine (ADDERALL XR) 20 MG 24 hr capsule, Take 1 capsule (20 mg total) by mouth every morning., Disp: 30 capsule, Rfl: 0 .  aspirin 81 MG tablet, Take 81 mg by mouth daily., Disp: , Rfl:  .  atorvastatin (LIPITOR) 20 MG tablet, Take 1 tablet (20 mg total) by mouth daily., Disp: 90 tablet, Rfl: 0 .  citalopram (CELEXA) 20 MG tablet, Take 1 tablet (20 mg total) by mouth daily., Disp: 90 tablet, Rfl: 0 .  metoprolol succinate (TOPROL-XL) 100 MG 24 hr tablet, Take 1 tablet (100 mg total) by mouth daily., Disp: 90 tablet, Rfl: 0 .  oxyCODONE (OXY IR/ROXICODONE) 5 MG immediate release tablet, Take 1 tablet (5 mg total) by mouth 3 (three) times daily  as needed for severe pain., Disp: 90 tablet, Rfl: 0 .  tiZANidine (ZANAFLEX) 2 MG tablet, Take 1 tablet (2 mg total) by mouth every 8 (eight) hours as needed for muscle spasms., Disp: 30 tablet, Rfl: 0  Allergies  Allergen Reactions  . No Known Allergies      ROS Please see history of present illness for complete discussion of ROS  Objective  Vitals:   02/22/17 0850  BP: 132/80  Pulse: (!) 110  Resp: 17  Temp: 98 F (36.7 C)  TempSrc: Oral  SpO2: 96%  Weight: 221 lb 1.6 oz (100.3 kg)  Height: 6' (1.829 m)    Physical Exam  Constitutional: He is oriented to person, place, and time and well-developed, well-nourished, and in no distress.  HENT:  Head: Normocephalic and atraumatic.  Cardiovascular: Normal rate, regular rhythm, S1 normal and S2 normal.   No murmur heard. Pulmonary/Chest: Breath sounds normal. He  has no wheezes. He has no rhonchi.  Abdominal: Soft. Bowel sounds are normal. There is no tenderness. There is no rebound.  Musculoskeletal:       Cervical back: He exhibits tenderness, pain and spasm.       Back:  Neurological: He is alert and oriented to person, place, and time.  Psychiatric: Mood, memory, affect and judgment normal.  Nursing note and vitals reviewed.    Assessment & Plan  1. Attention deficit disorder (ADD) without hyperactivity Stable on Adderall XR 20 mg every morning, no side effects reported - amphetamine-dextroamphetamine (ADDERALL XR) 20 MG 24 hr capsule; Take 1 capsule (20 mg total) by mouth every morning.  Dispense: 30 capsule; Refill: 0  2. Chronic cervical radiculopathy Patient is controlled on opioid therapy, compliant with controlled substance agreement - oxyCODONE (OXY IR/ROXICODONE) 5 MG immediate release tablet; Take 1 tablet (5 mg total) by mouth 3 (three) times daily as needed for severe pain.  Dispense: 90 tablet; Refill: 0  3. Mixed hyperlipidemia Now on atorvastatin 20 mg daily, recheck lipid panel in 3 months   Cristina Ceniceros  [facility] 02/22/2017 9:09 AM

## 2017-03-24 ENCOUNTER — Ambulatory Visit (INDEPENDENT_AMBULATORY_CARE_PROVIDER_SITE_OTHER): Payer: Medicare Other | Admitting: Family Medicine

## 2017-03-24 ENCOUNTER — Encounter: Payer: Self-pay | Admitting: Family Medicine

## 2017-03-24 DIAGNOSIS — M5412 Radiculopathy, cervical region: Secondary | ICD-10-CM | POA: Diagnosis not present

## 2017-03-24 DIAGNOSIS — I10 Essential (primary) hypertension: Secondary | ICD-10-CM

## 2017-03-24 DIAGNOSIS — F988 Other specified behavioral and emotional disorders with onset usually occurring in childhood and adolescence: Secondary | ICD-10-CM

## 2017-03-24 MED ORDER — OXYCODONE HCL 5 MG PO TABS: 5.0000 mg | ORAL_TABLET | Freq: Three times a day (TID) | ORAL | 0 refills | Status: DC | PRN

## 2017-03-24 MED ORDER — METOPROLOL SUCCINATE ER 100 MG PO TB24: 100.0000 mg | ORAL_TABLET | Freq: Every day | ORAL | 0 refills | Status: DC

## 2017-03-24 MED ORDER — AMPHETAMINE-DEXTROAMPHET ER 20 MG PO CP24: 20.0000 mg | ORAL_CAPSULE | ORAL | 0 refills | Status: DC

## 2017-03-24 NOTE — Progress Notes (Signed)
Name: [name]   MRN: [id number]    DOB: [birth year]   Date:03/24/2017       Progress Note  Subjective  Chief Complaint  Chief Complaint  Patient presents with  . Follow-up    1 mo  . Medication Refill    oxycodone / adderrall    Hypertension  This is a chronic problem. The problem is unchanged. The problem is controlled. Pertinent negatives include no blurred vision, chest pain, headaches, palpitations or shortness of breath. Past treatments include beta blockers. There is no history of kidney disease, CAD/MI or CVA.    Attention Deficit Disorder: He is taking Adderall XR 20 mg every morning for Attention Deficit Disorder,  seems to work with his symptoms of inattention, difficulty focusing, confusion with directionsetc. No side effects reported.  Chronic Neck Pain: Pain is rated at 9/10 today, worse compared to last month from sitting up at the hospital bedside of his mother who is in ICU [facility]. He Takes Oxycodone 5 mg three times daily as needed, along with heating pad at night and TENS unit for added relief. No side effects from opioids reported,  Past Medical History:  Diagnosis Date  . CAD (coronary artery disease) 8/08   cath, bare metal  . DVT (deep venous thrombosis) ([place]) 9/09   left leg found after ortho surgery  . HTN (hypertension)   . Hyperlipidemia   . Hypogonadism male   . Liver laceration 9/09  . Pneumothorax   . Pulmonary embolus ([place]) 9/09  . 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 Rib fractures 9/09   x 3  . Splenic laceration 9/09    Past Surgical History:  Procedure Laterality Date  . APPENDECTOMY    . BACK SURGERY    . CARDIAC CATHETERIZATION  8/08   90% focal R coronary, 80% R small obtuse marginal branch  . CERVICAL SPINE SURGERY  01/2009  . CORONARY STENT PLACEMENT  05/2007   right main  . FRACTURE SURGERY  9/09   orif, screw, syndesmotic screw  . splenic cautery  0/09  . THORACENTESIS  9/09   chest tube,  ICU    Family History  Problem Relation Age of Onset  . Dementia Mother     Social History   Social History  . Marital status: Married    Spouse name: N/A  . Number of children: N/A  . Years of education: N/A   Occupational History  . Not on file.   Social History Main Topics  . Smoking status: Current Every Day Smoker    Packs/day: 0.50    Years: 15.00    Types: Cigarettes  . Smokeless tobacco: Never Used  . Alcohol use No  . Drug use: No  . Sexual activity: Not on file   Other Topics Concern  . Not on file   Social History Narrative  . No narrative on file     Current Outpatient Prescriptions:  .  albuterol (PROVENTIL HFA;VENTOLIN HFA) 108 (90 Base) MCG/ACT inhaler, Inhale 2 puffs into the lungs every 6 (six) hours as needed for wheezing or shortness of breath., Disp: 1 Inhaler, Rfl: 0 .  amphetamine-dextroamphetamine (ADDERALL XR) 20 MG 24 hr capsule, Take 1 capsule (20 mg total) by mouth every morning., Disp: 30 capsule, Rfl: 0 .  aspirin 81 MG tablet, Take 81 mg by mouth daily., Disp: , Rfl:  .  atorvastatin (LIPITOR) 20 MG tablet, Take 1 tablet (20 mg total) by mouth daily.,  Disp: 90 tablet, Rfl: 0 .  citalopram (CELEXA) 20 MG tablet, Take 1 tablet (20 mg total) by mouth daily., Disp: 90 tablet, Rfl: 0 .  metoprolol succinate (TOPROL-XL) 100 MG 24 hr tablet, Take 1 tablet (100 mg total) by mouth daily., Disp: 90 tablet, Rfl: 0 .  oxyCODONE (OXY IR/ROXICODONE) 5 MG immediate release tablet, Take 1 tablet (5 mg total) by mouth 3 (three) times daily as needed for severe pain., Disp: 90 tablet, Rfl: 0 .  tiZANidine (ZANAFLEX) 2 MG tablet, Take 1 tablet (2 mg total) by mouth every 8 (eight) hours as needed for muscle spasms., Disp: 30 tablet, Rfl: 0  Allergies  Allergen Reactions  . No Known Allergies      Review of Systems  Eyes: Negative for blurred vision.  Respiratory: Negative for shortness of breath.   Cardiovascular: Negative for chest pain and  palpitations.  Neurological: Negative for headaches.      Objective  Vitals:   03/24/17 1147  BP: 130/80  Pulse: (!) 102  Resp: 17  Temp: 97.9 F (36.6 C)  TempSrc: Oral  SpO2: 98%  Weight: 218 lb 1.6 oz (98.9 kg)  Height: 6' (1.829 m)    Physical Exam  Constitutional: He is oriented to person, place, and time and well-developed, well-nourished, and in no distress.  HENT:  Head: Normocephalic and atraumatic.  Cardiovascular: Normal rate, regular rhythm and normal heart sounds.   No murmur heard. Pulmonary/Chest: Effort normal and breath sounds normal. He has no wheezes.  Musculoskeletal: He exhibits no edema.       Cervical back: He exhibits pain and spasm.       Back:  Neurological: He is alert and oriented to person, place, and time.  Psychiatric: Mood, memory, affect and judgment normal.  Nursing note and vitals reviewed.    Assessment & Plan  1. Attention deficit disorder (ADD) without hyperactivity Stable, symptoms responsive to Adderall taken every morning - amphetamine-dextroamphetamine (ADDERALL XR) 20 MG 24 hr capsule; Take 1 capsule (20 mg total) by mouth every morning.  Dispense: 30 capsule; Refill: 0  2. Chronic cervical radiculopathy Chronic cervical pain, relieved with oxycodone taken up to 3 times daily as needed. Patient compliant with controlled substances agreement. Refills provided - oxyCODONE (OXY IR/ROXICODONE) 5 MG immediate release tablet; Take 1 tablet (5 mg total) by mouth 3 (three) times daily as needed for severe pain.  Dispense: 90 tablet; Refill: 0  3. Essential hypertension BP stable on present antihypertensive therapy - metoprolol succinate (TOPROL-XL) 100 MG 24 hr tablet; Take 1 tablet (100 mg total) by mouth daily.  Dispense: 90 tablet; Refill: 0   Adeola Dennen [facility]ad A[facility] 03/24/2017 12:00 PM

## 2017-04-21 ENCOUNTER — Telehealth: Payer: Self-pay | Admitting: Family Medicine

## 2017-04-21 NOTE — Telephone Encounter (Signed)
Pt dropped off paperwork (disability) today to be filled out. Stated that it had to be submitted in by 05/06/17 or his insurance will be cancelled. The paperwork has been placed in dr Manuella Ghazi yellow folder

## 2017-04-22 NOTE — Telephone Encounter (Signed)
Disability paperwork has been submitted to Dr. Manuella Ghazi

## 2017-04-23 ENCOUNTER — Ambulatory Visit (INDEPENDENT_AMBULATORY_CARE_PROVIDER_SITE_OTHER): Payer: Medicare Other | Admitting: Family Medicine

## 2017-04-23 ENCOUNTER — Encounter: Payer: Self-pay | Admitting: Family Medicine

## 2017-04-23 DIAGNOSIS — F329 Major depressive disorder, single episode, unspecified: Secondary | ICD-10-CM

## 2017-04-23 DIAGNOSIS — F32A Depression, unspecified: Secondary | ICD-10-CM

## 2017-04-23 DIAGNOSIS — F988 Other specified behavioral and emotional disorders with onset usually occurring in childhood and adolescence: Secondary | ICD-10-CM

## 2017-04-23 DIAGNOSIS — M5412 Radiculopathy, cervical region: Secondary | ICD-10-CM

## 2017-04-23 MED ORDER — OXYCODONE HCL 5 MG PO TABS: 5.0000 mg | ORAL_TABLET | Freq: Three times a day (TID) | ORAL | 0 refills | Status: DC | PRN

## 2017-04-23 MED ORDER — CITALOPRAM HYDROBROMIDE 20 MG PO TABS: 20.0000 mg | ORAL_TABLET | Freq: Every day | ORAL | 0 refills | Status: DC

## 2017-04-23 MED ORDER — AMPHETAMINE-DEXTROAMPHET ER 20 MG PO CP24: 20.0000 mg | ORAL_CAPSULE | ORAL | 0 refills | Status: DC

## 2017-04-23 NOTE — Progress Notes (Signed)
Name: [name]   MRN: [id number]    DOB: [birth year]   Date:04/23/2017       Progress Note  Subjective  Chief Complaint  Chief Complaint  Patient presents with  . Follow-up    1 mo  . Medication Refill    Depression         This is a chronic problem.The problem is unchanged.  Associated symptoms include no fatigue, no helplessness, no decreased interest, not sad and no suicidal ideas.  Past treatments include SSRIs - Selective serotonin reuptake inhibitors.  Compliance with treatment is good.   Attention Deficit Disorder: He is taking Adderall XR 20 mg every morning for Attention Deficit Disorder, symptoms include inattention, difficulty focusing, confusion with directionsetc. No side effects reported.  Chronic Neck Pain: Pain is rated at 8/10 today, his mother passed away last month and is now taking care of his father, he Takes Oxycodone 5 mg three times daily as needed, along with heating pad at night and TENS unit for added relief. No side effects from opioids reported,   Past Medical History:  Diagnosis Date  . CAD (coronary artery disease) 8/08   cath, bare metal  . DVT (deep venous thrombosis) ([place]) 9/09   left leg found after ortho surgery  . HTN (hypertension)   . Hyperlipidemia   . Hypogonadism male   . Liver laceration 9/09  . Pneumothorax   . Pulmonary embolus ([place]) 9/09  . 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 Rib fractures 9/09   x 3  . Splenic laceration 9/09    Past Surgical History:  Procedure Laterality Date  . APPENDECTOMY    . BACK SURGERY    . CARDIAC CATHETERIZATION  8/08   90% focal R coronary, 80% R small obtuse marginal branch  . CERVICAL SPINE SURGERY  01/2009  . CORONARY STENT PLACEMENT  05/2007   right main  . FRACTURE SURGERY  9/09   orif, screw, syndesmotic screw  . splenic cautery  0/09  . THORACENTESIS  9/09   chest tube, ICU    Family History  Problem Relation Age of Onset  . Dementia Mother      Social History   Social History  . Marital status: Married    Spouse name: N/A  . Number of children: N/A  . Years of education: N/A   Occupational History  . Not on file.   Social History Main Topics  . Smoking status: Current Every Day Smoker    Packs/day: 0.50    Years: 15.00    Types: Cigarettes  . Smokeless tobacco: Never Used  . Alcohol use No  . Drug use: No  . Sexual activity: Not on file   Other Topics Concern  . Not on file   Social History Narrative  . No narrative on file     Current Outpatient Prescriptions:  .  albuterol (PROVENTIL HFA;VENTOLIN HFA) 108 (90 Base) MCG/ACT inhaler, Inhale 2 puffs into the lungs every 6 (six) hours as needed for wheezing or shortness of breath., Disp: 1 Inhaler, Rfl: 0 .  amphetamine-dextroamphetamine (ADDERALL XR) 20 MG 24 hr capsule, Take 1 capsule (20 mg total) by mouth every morning., Disp: 30 capsule, Rfl: 0 .  aspirin 81 MG tablet, Take 81 mg by mouth daily., Disp: , Rfl:  .  atorvastatin (LIPITOR) 20 MG tablet, Take 1 tablet (20 mg total) by mouth daily., Disp: 90 tablet, Rfl: 0 .  citalopram (CELEXA) 20 MG tablet, Take  1 tablet (20 mg total) by mouth daily., Disp: 90 tablet, Rfl: 0 .  metoprolol succinate (TOPROL-XL) 100 MG 24 hr tablet, Take 1 tablet (100 mg total) by mouth daily., Disp: 90 tablet, Rfl: 0 .  oxyCODONE (OXY IR/ROXICODONE) 5 MG immediate release tablet, Take 1 tablet (5 mg total) by mouth 3 (three) times daily as needed for severe pain., Disp: 90 tablet, Rfl: 0 .  tiZANidine (ZANAFLEX) 2 MG tablet, Take 1 tablet (2 mg total) by mouth every 8 (eight) hours as needed for muscle spasms., Disp: 30 tablet, Rfl: 0  Allergies  Allergen Reactions  . No Known Allergies      Review of Systems  Constitutional: Negative for fatigue.  Psychiatric/Behavioral: Positive for depression. Negative for suicidal ideas.      Objective  Vitals:   04/23/17 0828  BP: 132/76  Pulse: 90  Resp: 17  Temp: 97.7  F (36.5 C)  TempSrc: Oral  SpO2: 98%  Weight: 213 lb 14.4 oz (97 kg)  Height: 6' (1.829 m)    Physical Exam  Constitutional: He is oriented to person, place, and time and well-developed, well-nourished, and in no distress.  HENT:  Head: Normocephalic and atraumatic.  Cardiovascular: Normal rate, regular rhythm and normal heart sounds.   No murmur heard. Pulmonary/Chest: Effort normal and breath sounds normal. He has no wheezes.  Musculoskeletal: He exhibits edema and tenderness.       Cervical back: He exhibits tenderness, pain and spasm.       Back:  Neurological: He is alert and oriented to person, place, and time.  Psychiatric: Mood, memory, affect and judgment normal.  Nursing note and vitals reviewed.      Assessment & Plan  1. Attention deficit disorder (ADD) without hyperactivity Stable and responsive to Adderall taken every morning, no side effects reported. - amphetamine-dextroamphetamine (ADDERALL XR) 20 MG 24 hr capsule; Take 1 capsule (20 mg total) by mouth every morning.  Dispense: 30 capsule; Refill: 0  2. Chronic cervical radiculopathy Stable and responsive to opioid treatment, patient compliant with controlled substances agreement, refills provided - oxyCODONE (OXY IR/ROXICODONE) 5 MG immediate release tablet; Take 1 tablet (5 mg total) by mouth 3 (three) times daily as needed for severe pain.  Dispense: 90 tablet; Refill: 0  3. Depression, unspecified depression type  - citalopram (CELEXA) 20 MG tablet; Take 1 tablet (20 mg total) by mouth daily.  Dispense: 90 tablet; Refill: 0   Eli Pattillo [facility]ad A[facility] 04/23/2017 8:32 AM

## 2017-04-26 ENCOUNTER — Other Ambulatory Visit: Payer: Self-pay | Admitting: Family Medicine

## 2017-04-26 DIAGNOSIS — F329 Major depressive disorder, single episode, unspecified: Secondary | ICD-10-CM

## 2017-04-26 DIAGNOSIS — F32A Depression, unspecified: Secondary | ICD-10-CM

## 2017-04-28 ENCOUNTER — Ambulatory Visit: Payer: Medicare Other | Admitting: Family Medicine

## 2017-04-28 ENCOUNTER — Encounter: Payer: Self-pay | Admitting: Family Medicine

## 2017-04-29 NOTE — Progress Notes (Signed)
This encounter was created in error - please disregard.

## 2017-05-24 ENCOUNTER — Encounter: Payer: Self-pay | Admitting: Family Medicine

## 2017-05-24 ENCOUNTER — Ambulatory Visit (INDEPENDENT_AMBULATORY_CARE_PROVIDER_SITE_OTHER): Payer: Medicare Other | Admitting: Family Medicine

## 2017-05-24 DIAGNOSIS — M5412 Radiculopathy, cervical region: Secondary | ICD-10-CM

## 2017-05-24 DIAGNOSIS — F988 Other specified behavioral and emotional disorders with onset usually occurring in childhood and adolescence: Secondary | ICD-10-CM

## 2017-05-24 MED ORDER — OXYCODONE HCL 5 MG PO TABS: 5.0000 mg | ORAL_TABLET | Freq: Three times a day (TID) | ORAL | 0 refills | Status: DC | PRN

## 2017-05-24 MED ORDER — AMPHETAMINE-DEXTROAMPHET ER 20 MG PO CP24: 20.0000 mg | ORAL_CAPSULE | ORAL | 0 refills | Status: DC

## 2017-05-24 NOTE — Progress Notes (Signed)
Name: [name]   MRN: [id number]    DOB: 12-17-1960   Date:05/24/2017       Progress Note  Subjective  Chief Complaint  Chief Complaint  Patient presents with  . Medication Refill    Neck Pain   This is a chronic problem. The problem occurs constantly. The problem has been gradually worsening (he fell backwards onto the floor of his carport yesterday  after slipping in the water). The pain is present in the midline. The pain is at a severity of 8/10. The pain is moderate. The symptoms are aggravated by position. He has tried oral narcotics for the symptoms.    Attention Deficit Disorder: Patient presents for medication refill on Adderall, he takes Adderall 20 mg for attention deficit disorder, symptoms include inattention and trouble focusing, no reported side effects with Adderall.  Past Medical History:  Diagnosis Date  . CAD (coronary artery disease) 8/08   cath, bare metal  . DVT (deep venous thrombosis) ([place]) 9/09   left leg found after ortho surgery  . HTN (hypertension)   . Hyperlipidemia   . Hypogonadism male   . Liver laceration 9/09  . Pneumothorax   . Pulmonary embolus ([place]) 9/09  . 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 Rib fractures 9/09   x 3  . Splenic laceration 9/09    Past Surgical History:  Procedure Laterality Date  . APPENDECTOMY    . BACK SURGERY    . CARDIAC CATHETERIZATION  8/08   90% focal R coronary, 80% R small obtuse marginal branch  . CERVICAL SPINE SURGERY  01/2009  . CORONARY STENT PLACEMENT  05/2007   right main  . FRACTURE SURGERY  9/09   orif, screw, syndesmotic screw  . splenic cautery  0/09  . THORACENTESIS  9/09   chest tube, ICU    Family History  Problem Relation Age of Onset  . Dementia Mother     Social History   Social History  . Marital status: Married    Spouse name: N/A  . Number of children: N/A  . Years of education: N/A   Occupational History  . Not on file.   Social History  Main Topics  . Smoking status: Current Every Day Smoker    Packs/day: 0.50    Years: 15.00    Types: Cigarettes  . Smokeless tobacco: Never Used  . Alcohol use No  . Drug use: No  . Sexual activity: Yes    Partners: Female    Birth control/ protection: None   Other Topics Concern  . Not on file   Social History Narrative  . No narrative on file     Current Outpatient Prescriptions:  .  albuterol (PROVENTIL HFA;VENTOLIN HFA) 108 (90 Base) MCG/ACT inhaler, Inhale 2 puffs into the lungs every 6 (six) hours as needed for wheezing or shortness of breath., Disp: 1 Inhaler, Rfl: 0 .  amphetamine-dextroamphetamine (ADDERALL XR) 20 MG 24 hr capsule, Take 1 capsule (20 mg total) by mouth every morning., Disp: 30 capsule, Rfl: 0 .  aspirin 81 MG tablet, Take 81 mg by mouth daily., Disp: , Rfl:  .  atorvastatin (LIPITOR) 20 MG tablet, Take 1 tablet (20 mg total) by mouth daily., Disp: 90 tablet, Rfl: 0 .  citalopram (CELEXA) 20 MG tablet, TAKE 1 TABLET BY MOUTH ONCE A DAY, Disp: 90 tablet, Rfl: 0 .  metoprolol succinate (TOPROL-XL) 100 MG 24 hr tablet, Take 1 tablet (100 mg total) by  mouth daily., Disp: 90 tablet, Rfl: 0 .  oxyCODONE (OXY IR/ROXICODONE) 5 MG immediate release tablet, Take 1 tablet (5 mg total) by mouth 3 (three) times daily as needed for severe pain., Disp: 90 tablet, Rfl: 0  Allergies  Allergen Reactions  . No Known Allergies      Review of Systems  Musculoskeletal: Positive for neck pain.    Please see history of present illness for complete discussion of ROS  Objective  Vitals:   05/24/17 0922  BP: 138/84  Pulse: 91  Resp: 18  Temp: 98.1 F (36.7 C)  TempSrc: Oral  SpO2: 97%  Weight: 218 lb 12.8 oz (99.2 kg)  Height: 6' (1.829 m)    Physical Exam  Constitutional: He is oriented to person, place, and time and well-developed, well-nourished, and in no distress.  HENT:  Head: Normocephalic and atraumatic.  Cardiovascular: Normal rate, regular rhythm  and normal heart sounds.   No murmur heard. Pulmonary/Chest: Effort normal and breath sounds normal. He has no wheezes.  Abdominal: Soft. Bowel sounds are normal.  Musculoskeletal:       Cervical back: He exhibits tenderness.       Back:  Neurological: He is alert and oriented to person, place, and time.  Psychiatric: Mood, memory, affect and judgment normal.  Nursing note and vitals reviewed.     Assessment & Plan  1. Attention deficit disorder (ADD) without hyperactivity Stable, refill for Adderall provided - amphetamine-dextroamphetamine (ADDERALL XR) 20 MG 24 hr capsule; Take 1 capsule (20 mg total) by mouth every morning.  Dispense: 30 capsule; Refill: 0  2. Chronic cervical radiculopathy Marginally worse after he sustained a fall on his carport yesterday, continue on Oxycodone 5 mg every 8 hours as needed, refills provided - oxyCODONE (OXY IR/ROXICODONE) 5 MG immediate release tablet; Take 1 tablet (5 mg total) by mouth 3 (three) times daily as needed for severe pain.  Dispense: 90 tablet; Refill: 0   Mccauley Diehl Asad A[facility] 05/24/2017 9:44 AM

## 2017-06-02 ENCOUNTER — Other Ambulatory Visit: Payer: Self-pay | Admitting: Family Medicine

## 2017-06-02 DIAGNOSIS — F32A Depression, unspecified: Secondary | ICD-10-CM

## 2017-06-02 DIAGNOSIS — F329 Major depressive disorder, single episode, unspecified: Secondary | ICD-10-CM

## 2017-06-02 NOTE — Telephone Encounter (Signed)
Pt needs refill on Celexa to go to [facility]

## 2017-06-21 ENCOUNTER — Encounter: Payer: Self-pay | Admitting: Family Medicine

## 2017-06-21 ENCOUNTER — Ambulatory Visit (INDEPENDENT_AMBULATORY_CARE_PROVIDER_SITE_OTHER): Payer: Medicare Other | Admitting: Family Medicine

## 2017-06-21 DIAGNOSIS — F988 Other specified behavioral and emotional disorders with onset usually occurring in childhood and adolescence: Secondary | ICD-10-CM | POA: Diagnosis not present

## 2017-06-21 DIAGNOSIS — M5412 Radiculopathy, cervical region: Secondary | ICD-10-CM

## 2017-06-21 MED ORDER — OXYCODONE HCL 5 MG PO TABS: 5.0000 mg | ORAL_TABLET | Freq: Three times a day (TID) | ORAL | 0 refills | Status: DC | PRN

## 2017-06-21 MED ORDER — AMPHETAMINE-DEXTROAMPHET ER 20 MG PO CP24: 20.0000 mg | ORAL_CAPSULE | ORAL | 0 refills | Status: DC

## 2017-06-21 NOTE — Progress Notes (Signed)
Name: [name]   MRN: [id number]    DOB: 07-03-1961   Date:06/21/2017       Progress Note  Subjective  Chief Complaint  Chief Complaint  Patient presents with  . Follow-up    1 month   . Medication Refill    adderall and oxycodone     Neck Pain   This is a chronic problem. The problem occurs constantly. The problem has been gradually worsening (worse becasue working around the house in the aftermath of [place]). The pain is present in the midline. The pain is at a severity of 9/10. The pain is moderate. The symptoms are aggravated by position. Stiffness is present all day. Pertinent negatives include no fever, headaches, leg pain or paresis. [name] has tried oral narcotics for the symptoms.   Attention Deficit Disorder: Patient has Attention Deficit disorder symptoms include difficulty focusing, sustaining attention, easily distracted etc. [name] takes Adderall XR 20 mg every morning, this seems to help relieve his symptoms. No reported side effects.   Past Medical History:  Diagnosis Date  . CAD (coronary artery disease) 8/08   cath, bare metal  . DVT (deep venous thrombosis) ([place]) 9/09   left leg found after ortho surgery  . HTN (hypertension)   . Hyperlipidemia   . Hypogonadism male   . Liver laceration 9/09  . Pneumothorax   . Pulmonary embolus ([place]) 9/09  . 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 Rib fractures 9/09   x 3  . Splenic laceration 9/09    Past Surgical History:  Procedure Laterality Date  . APPENDECTOMY    . BACK SURGERY    . CARDIAC CATHETERIZATION  8/08   90% focal R coronary, 80% R small obtuse marginal branch  . CERVICAL SPINE SURGERY  01/2009  . CORONARY STENT PLACEMENT  05/2007   right main  . FRACTURE SURGERY  9/09   orif, screw, syndesmotic screw  . splenic cautery  0/09  . THORACENTESIS  9/09   chest tube, ICU    Family History  Problem Relation Age of Onset  . Dementia Mother   . Congestive Heart Failure  Mother   . Cancer Maternal Grandmother   . Heart attack Maternal Grandfather     Social History   Social History  . Marital status: Married    Spouse name: N/A  . Number of children: N/A  . Years of education: N/A   Occupational History  . Not on file.   Social History Main Topics  . Smoking status: Current Every Day Smoker    Packs/day: 0.50    Years: 15.00    Types: Cigarettes  . Smokeless tobacco: Never Used  . Alcohol use No  . Drug use: No  . Sexual activity: Yes    Partners: Female    Birth control/ protection: None   Other Topics Concern  . Not on file   Social History Narrative  . No narrative on file     Current Outpatient Prescriptions:  .  albuterol (PROVENTIL HFA;VENTOLIN HFA) 108 (90 Base) MCG/ACT inhaler, Inhale 2 puffs into the lungs every 6 (six) hours as needed for wheezing or shortness of breath., Disp: 1 Inhaler, Rfl: 0 .  amphetamine-dextroamphetamine (ADDERALL XR) 20 MG 24 hr capsule, Take 1 capsule (20 mg total) by mouth every morning., Disp: 30 capsule, Rfl: 0 .  aspirin 81 MG tablet, Take 81 mg by mouth daily., Disp: , Rfl:  .  atorvastatin (LIPITOR) 20 MG  tablet, Take 1 tablet (20 mg total) by mouth daily. (Patient taking differently: Take 20 mg by mouth at bedtime. ), Disp: 90 tablet, Rfl: 0 .  citalopram (CELEXA) 20 MG tablet, TAKE 1 TABLET BY MOUTH ONCE A DAY, Disp: 90 tablet, Rfl: 0 .  metoprolol succinate (TOPROL-XL) 100 MG 24 hr tablet, Take 1 tablet (100 mg total) by mouth daily., Disp: 90 tablet, Rfl: 0 .  oxyCODONE (OXY IR/ROXICODONE) 5 MG immediate release tablet, Take 1 tablet (5 mg total) by mouth 3 (three) times daily as needed for severe pain., Disp: 90 tablet, Rfl: 0  Allergies  Allergen Reactions  . No Known Allergies      Review of Systems  Constitutional: Negative for fever.  Musculoskeletal: Positive for neck pain.  Neurological: Negative for headaches.     Objective  Vitals:   06/21/17 0836  BP: 128/84  Pulse:  100  Resp: 18  Temp: 98 F (36.7 C)  TempSrc: Oral  SpO2: 95%  Weight: 209 lb 8 oz (95 kg)  Height: 6\' 2"  (1.88 m)    Physical Exam  Constitutional: [name] is oriented to person, place, and time and well-developed, well-nourished, and in no distress.  Cardiovascular: Normal rate, regular rhythm and normal heart sounds.   No murmur heard. Pulmonary/Chest: Effort normal and breath sounds normal. [name] has no wheezes.  Musculoskeletal: [name] exhibits no edema.       Cervical back: [name] exhibits tenderness, pain and spasm.       Back:  Neurological: [name] is alert and oriented to person, place, and time.  Psychiatric: Mood, memory, affect and judgment normal.  Nursing note and vitals reviewed.    Assessment & Plan  1. Chronic cervical radiculopathy Stable overall on chronic opioid treatment although slightly worse after working outside in his yard trying to remove trees. patient compliant with controlled substances agreement, refills provided - oxyCODONE (OXY IR/ROXICODONE) 5 MG immediate release tablet; Take 1 tablet (5 mg total) by mouth 3 (three) times daily as needed for severe pain.  Dispense: 90 tablet; Refill: 0  2. Attention deficit disorder (ADD) without hyperactivity Symptoms of attention deficit are stable and controlled on Adderall, refills provided- amphetamine-dextroamphetamine (ADDERALL XR) 20 MG 24 hr capsule; Take 1 capsule (20 mg total) by mouth every morning.  Dispense: 30 capsule; Refill: 0   Sakai Wolford Asad A[facility] 06/21/2017 9:14 AM

## 2017-07-21 ENCOUNTER — Ambulatory Visit: Payer: Medicare Other | Admitting: Family Medicine

## 2017-07-21 ENCOUNTER — Encounter: Payer: Self-pay | Admitting: Family Medicine

## 2017-07-21 DIAGNOSIS — F988 Other specified behavioral and emotional disorders with onset usually occurring in childhood and adolescence: Secondary | ICD-10-CM | POA: Diagnosis not present

## 2017-07-21 DIAGNOSIS — M542 Cervicalgia: Secondary | ICD-10-CM | POA: Diagnosis not present

## 2017-07-21 DIAGNOSIS — G8929 Other chronic pain: Secondary | ICD-10-CM

## 2017-07-21 DIAGNOSIS — I10 Essential (primary) hypertension: Secondary | ICD-10-CM

## 2017-07-21 DIAGNOSIS — M549 Dorsalgia, unspecified: Secondary | ICD-10-CM

## 2017-07-21 DIAGNOSIS — F32A Depression, unspecified: Secondary | ICD-10-CM

## 2017-07-21 DIAGNOSIS — F329 Major depressive disorder, single episode, unspecified: Secondary | ICD-10-CM | POA: Diagnosis not present

## 2017-07-21 MED ORDER — CITALOPRAM HYDROBROMIDE 20 MG PO TABS: 20.0000 mg | ORAL_TABLET | Freq: Every day | ORAL | 0 refills | Status: DC

## 2017-07-21 MED ORDER — OXYCODONE HCL 5 MG PO TABS: 5.0000 mg | ORAL_TABLET | Freq: Three times a day (TID) | ORAL | 0 refills | Status: DC | PRN

## 2017-07-21 MED ORDER — METOPROLOL SUCCINATE ER 100 MG PO TB24: 100.0000 mg | ORAL_TABLET | Freq: Every day | ORAL | 0 refills | Status: DC

## 2017-07-21 MED ORDER — AMPHETAMINE-DEXTROAMPHET ER 20 MG PO CP24: 20.0000 mg | ORAL_CAPSULE | ORAL | 0 refills | Status: DC

## 2017-07-21 NOTE — Progress Notes (Signed)
The following letter was provided to Vick Frees during their visit today: ---------------------------------------  Dear valued [facility] Patient,  I am writing to share that as of October 22, 2017, I will no longer be seeing patients at [facility]. While it has been my privilege to care for you as a physician, I have decided to move outside of [place] to pursue other opportunities.  The staff at [facility] has been supportive of my decision and are supportive of any patients who have been under my care.  They will be happy to provide care to you and your family.  The office staff will do everything they can to ensure a seamless transition of care at [facility].  However if you are on any controlled substance medications (i.e. Pain medication or benzodiazepines), they will no longer be able to refill those medications, but we will be more than happy to refer you to a specialist.  [facility] will also assist you with the transfer of medical records should you wish to seek care elsewhere.  If you have any questions about your future care, you may call the office at [phone].  I have enjoyed getting to know my patients here and I wish you the very best.  Sincerely,  Rochel Brome, MD  ---------------------------------------  A written copy of this letter was given to the patient.  The patient verbalizes understanding of the letter, and does request referral to specialist or to new primary care provider.  This decision has been conveyed to Dr. Manuella Ghazi, who will place any appropriate referrals during today's visit.
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
## 2017-08-18 ENCOUNTER — Ambulatory Visit: Payer: Self-pay | Admitting: Student in an Organized Health Care Education/Training Program

## 2017-08-20 ENCOUNTER — Ambulatory Visit (INDEPENDENT_AMBULATORY_CARE_PROVIDER_SITE_OTHER): Payer: Medicare Other | Admitting: Family Medicine

## 2017-08-20 ENCOUNTER — Encounter: Payer: Self-pay | Admitting: Family Medicine

## 2017-08-20 VITALS — BP 122/78 | HR 70 | Temp 97.9°F | Ht 74.0 in | Wt 212.3 lb

## 2017-08-20 DIAGNOSIS — M542 Cervicalgia: Secondary | ICD-10-CM

## 2017-08-20 DIAGNOSIS — F988 Other specified behavioral and emotional disorders with onset usually occurring in childhood and adolescence: Secondary | ICD-10-CM

## 2017-08-20 DIAGNOSIS — M549 Dorsalgia, unspecified: Secondary | ICD-10-CM | POA: Diagnosis not present

## 2017-08-20 DIAGNOSIS — G8929 Other chronic pain: Secondary | ICD-10-CM

## 2017-08-20 DIAGNOSIS — M25552 Pain in left hip: Secondary | ICD-10-CM

## 2017-08-20 MED ORDER — OXYCODONE HCL 5 MG PO TABS: 5.0000 mg | ORAL_TABLET | Freq: Three times a day (TID) | ORAL | 0 refills | Status: DC | PRN

## 2017-08-20 MED ORDER — AMPHETAMINE-DEXTROAMPHET ER 20 MG PO CP24: 20.0000 mg | ORAL_CAPSULE | ORAL | 0 refills | Status: DC
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
## 2017-09-17 ENCOUNTER — Ambulatory Visit: Payer: Medicare Other | Admitting: Family Medicine

## 2017-09-17 ENCOUNTER — Encounter: Payer: Self-pay | Admitting: Family Medicine

## 2017-09-17 VITALS — BP 120/80 | HR 58 | Resp 16 | Wt 213.2 lb

## 2017-09-17 DIAGNOSIS — E781 Pure hyperglyceridemia: Secondary | ICD-10-CM

## 2017-09-17 DIAGNOSIS — F1994 Other psychoactive substance use, unspecified with psychoactive substance-induced mood disorder: Secondary | ICD-10-CM

## 2017-09-17 DIAGNOSIS — I499 Cardiac arrhythmia, unspecified: Secondary | ICD-10-CM | POA: Diagnosis not present

## 2017-09-17 DIAGNOSIS — M542 Cervicalgia: Secondary | ICD-10-CM | POA: Diagnosis not present

## 2017-09-17 DIAGNOSIS — M5412 Radiculopathy, cervical region: Secondary | ICD-10-CM | POA: Diagnosis not present

## 2017-09-17 DIAGNOSIS — F988 Other specified behavioral and emotional disorders with onset usually occurring in childhood and adolescence: Secondary | ICD-10-CM

## 2017-09-17 DIAGNOSIS — Z87891 Personal history of nicotine dependence: Secondary | ICD-10-CM

## 2017-09-17 DIAGNOSIS — I10 Essential (primary) hypertension: Secondary | ICD-10-CM

## 2017-09-17 DIAGNOSIS — G8929 Other chronic pain: Secondary | ICD-10-CM

## 2017-09-17 DIAGNOSIS — M549 Dorsalgia, unspecified: Secondary | ICD-10-CM

## 2017-09-17 DIAGNOSIS — I2510 Atherosclerotic heart disease of native coronary artery without angina pectoris: Secondary | ICD-10-CM

## 2017-09-17 DIAGNOSIS — E782 Mixed hyperlipidemia: Secondary | ICD-10-CM

## 2017-09-17 DIAGNOSIS — R0989 Other specified symptoms and signs involving the circulatory and respiratory systems: Secondary | ICD-10-CM

## 2017-09-17 MED ORDER — ASPIRIN EC 325 MG PO TBEC: 325.0000 mg | DELAYED_RELEASE_TABLET | Freq: Every day | ORAL | 0 refills | Status: DC

## 2017-09-17 MED ORDER — ACETAMINOPHEN 500 MG PO TABS: 500.0000 mg | ORAL_TABLET | Freq: Four times a day (QID) | ORAL | 0 refills | Status: AC | PRN
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
## 2017-09-18 LAB — CBC WITH DIFFERENTIAL/PLATELET
BASOS PCT: 0.3 %
Basophils Absolute: 29 cells/uL (ref 0–200)
EOS PCT: 1.7 %
Eosinophils Absolute: 162 cells/uL (ref 15–500)
HEMATOCRIT: 40.5 % (ref 38.5–50.0)
HEMOGLOBIN: 14.2 g/dL (ref 13.2–17.1)
LYMPHS ABS: 3221 {cells}/uL (ref 850–3900)
MCH: 32.2 pg (ref 27.0–33.0)
MCHC: 35.1 g/dL (ref 32.0–36.0)
MCV: 91.8 fL (ref 80.0–100.0)
MPV: 9.2 fL (ref 7.5–12.5)
Monocytes Relative: 8.1 %
NEUTROS ABS: 5320 {cells}/uL (ref 1500–7800)
Neutrophils Relative %: 56 %
Platelets: 347 10*3/uL (ref 140–400)
RBC: 4.41 10*6/uL (ref 4.20–5.80)
RDW: 12.5 % (ref 11.0–15.0)
Total Lymphocyte: 33.9 %
WBC mixed population: 770 cells/uL (ref 200–950)
WBC: 9.5 10*3/uL (ref 3.8–10.8)

## 2017-09-18 LAB — COMPLETE METABOLIC PANEL WITH GFR
AG Ratio: 1.7 (calc) (ref 1.0–2.5)
ALT: 15 U/L (ref 9–46)
AST: 15 U/L (ref 10–35)
Albumin: 4.5 g/dL (ref 3.6–5.1)
Alkaline phosphatase (APISO): 72 U/L (ref 40–115)
BILIRUBIN TOTAL: 0.5 mg/dL (ref 0.2–1.2)
BUN: 14 mg/dL (ref 7–25)
CALCIUM: 9.4 mg/dL (ref 8.6–10.3)
CHLORIDE: 103 mmol/L (ref 98–110)
CO2: 27 mmol/L (ref 20–32)
Creat: 0.87 mg/dL (ref 0.70–1.33)
GFR, EST AFRICAN AMERICAN: 112 mL/min/{1.73_m2} (ref >=60)
GFR, Est Non African American: 96 mL/min/{1.73_m2} (ref >=60)
GLUCOSE: 111 mg/dL — AB (ref 65–99)
Globulin: 2.6 g/dL (calc) (ref 1.9–3.7)
POTASSIUM: 4 mmol/L (ref 3.5–5.3)
Sodium: 138 mmol/L (ref 135–146)
TOTAL PROTEIN: 7.1 g/dL (ref 6.1–8.1)

## 2017-09-18 LAB — LIPID PANEL
Cholesterol: 192 mg/dL (ref <200)
HDL: 20 mg/dL — AB (ref >40)
LDL CHOLESTEROL (CALC): 142 mg/dL — AB
Non-HDL Cholesterol (Calc): 172 mg/dL (calc) — ABNORMAL HIGH (ref <130)
TRIGLYCERIDES: 161 mg/dL — AB (ref <150)
Total CHOL/HDL Ratio: 9.6 (calc) — ABNORMAL HIGH (ref <5.0)

## 2017-09-20 ENCOUNTER — Telehealth: Payer: Self-pay | Admitting: Family Medicine

## 2017-09-20 MED ORDER — ROSUVASTATIN CALCIUM 40 MG PO TABS: 40.0000 mg | ORAL_TABLET | Freq: Every day | ORAL | 3 refills | Status: DC

## 2017-09-20 NOTE — Telephone Encounter (Signed)
Spoke with patient regarding his recent lab results. He was not fasting the day his labs were drawn- so his glucose level being elevated that day does make sense.  He was advised about the cholesterol readings and he will work on his diet- he is willing to change his medication. Crestor 40 mg daily sent to patient pharmacy per lab order.

## 2017-09-20 NOTE — Telephone Encounter (Signed)
Not to worry about glucose if he was not fasting, level up to 140 post-prandial is normal

## 2017-09-23 ENCOUNTER — Ambulatory Visit: Payer: Medicare Other | Admitting: Family Medicine

## 2017-10-01 ENCOUNTER — Ambulatory Visit
Admission: RE | Admit: 2017-10-01 | Discharge: 2017-10-01 | Disposition: A | Discharge status: Home / Self Care | Payer: Medicare Other | Source: Ambulatory Visit | Attending: Family Medicine | Admitting: Family Medicine

## 2017-10-01 DIAGNOSIS — J439 Emphysema, unspecified: Secondary | ICD-10-CM | POA: Diagnosis not present

## 2017-10-01 DIAGNOSIS — I700 Atherosclerosis of aorta: Secondary | ICD-10-CM | POA: Insufficient documentation

## 2017-10-01 DIAGNOSIS — Z87891 Personal history of nicotine dependence: Secondary | ICD-10-CM | POA: Diagnosis present

## 2017-10-01 DIAGNOSIS — R0989 Other specified symptoms and signs involving the circulatory and respiratory systems: Secondary | ICD-10-CM | POA: Diagnosis present

## 2017-10-03 ENCOUNTER — Encounter: Payer: Self-pay | Admitting: Family Medicine

## 2017-10-03 DIAGNOSIS — J439 Emphysema, unspecified: Secondary | ICD-10-CM | POA: Insufficient documentation

## 2017-10-03 DIAGNOSIS — I700 Atherosclerosis of aorta: Secondary | ICD-10-CM | POA: Insufficient documentation

## 2017-10-03 DIAGNOSIS — K8020 Calculus of gallbladder without cholecystitis without obstruction: Secondary | ICD-10-CM | POA: Insufficient documentation

## 2017-10-03 DIAGNOSIS — I2510 Atherosclerotic heart disease of native coronary artery without angina pectoris: Secondary | ICD-10-CM | POA: Insufficient documentation

## 2017-10-03 DIAGNOSIS — I2584 Coronary atherosclerosis due to calcified coronary lesion: Secondary | ICD-10-CM
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
## 2017-10-05 ENCOUNTER — Telehealth: Payer: Self-pay | Admitting: Cardiovascular Disease

## 2017-10-05 NOTE — Telephone Encounter (Signed)
Lmov for patient to call back to reschedule  Change in provider's schedule  Will need to try again

## 2017-10-06 ENCOUNTER — Ambulatory Visit (INDEPENDENT_AMBULATORY_CARE_PROVIDER_SITE_OTHER): Payer: Medicare Other | Admitting: Psychiatry

## 2017-10-06 ENCOUNTER — Encounter (HOSPITAL_COMMUNITY): Payer: Self-pay | Admitting: Psychiatry

## 2017-10-06 VITALS — BP 138/86 | HR 79 | Ht 74.0 in | Wt 216.0 lb

## 2017-10-06 DIAGNOSIS — S069X9S Unspecified intracranial injury with loss of consciousness of unspecified duration, sequela: Secondary | ICD-10-CM | POA: Diagnosis not present

## 2017-10-06 DIAGNOSIS — S069X9A Unspecified intracranial injury with loss of consciousness of unspecified duration, initial encounter: Secondary | ICD-10-CM

## 2017-10-06 DIAGNOSIS — F909 Attention-deficit hyperactivity disorder, unspecified type: Secondary | ICD-10-CM

## 2017-10-06 DIAGNOSIS — Z79899 Other long term (current) drug therapy: Secondary | ICD-10-CM

## 2017-10-06 DIAGNOSIS — F330 Major depressive disorder, recurrent, mild: Secondary | ICD-10-CM | POA: Diagnosis not present

## 2017-10-06 DIAGNOSIS — F17210 Nicotine dependence, cigarettes, uncomplicated: Secondary | ICD-10-CM

## 2017-10-06 DIAGNOSIS — F1121 Opioid dependence, in remission: Secondary | ICD-10-CM | POA: Diagnosis not present

## 2017-10-06 NOTE — Patient Instructions (Signed)
1. Continue citalopram 20 mg daily  2. Hold Adderall at this time 3. Referral to neuropsychological evaluation 4. Return to clinic in one to two months for 30 mins (after you complete neuropsychological evaluation)

## 2017-10-15 ENCOUNTER — Encounter: Payer: Medicare Other | Attending: Psychology | Admitting: Psychology

## 2017-10-15 DIAGNOSIS — F419 Anxiety disorder, unspecified: Secondary | ICD-10-CM | POA: Insufficient documentation

## 2017-10-15 DIAGNOSIS — F411 Generalized anxiety disorder: Secondary | ICD-10-CM

## 2017-10-15 DIAGNOSIS — X58XXXA Exposure to other specified factors, initial encounter: Secondary | ICD-10-CM | POA: Diagnosis not present

## 2017-10-15 DIAGNOSIS — F339 Major depressive disorder, recurrent, unspecified: Secondary | ICD-10-CM | POA: Insufficient documentation

## 2017-10-15 DIAGNOSIS — I10 Essential (primary) hypertension: Secondary | ICD-10-CM | POA: Insufficient documentation

## 2017-10-15 DIAGNOSIS — F331 Major depressive disorder, recurrent, moderate: Secondary | ICD-10-CM

## 2017-10-15 DIAGNOSIS — E785 Hyperlipidemia, unspecified: Secondary | ICD-10-CM | POA: Diagnosis not present

## 2017-10-15 DIAGNOSIS — Z86718 Personal history of other venous thrombosis and embolism: Secondary | ICD-10-CM | POA: Diagnosis not present

## 2017-10-15 DIAGNOSIS — I2510 Atherosclerotic heart disease of native coronary artery without angina pectoris: Secondary | ICD-10-CM | POA: Insufficient documentation

## 2017-10-15 DIAGNOSIS — R4189 Other symptoms and signs involving cognitive functions and awareness: Secondary | ICD-10-CM

## 2017-10-15 DIAGNOSIS — S069X9A Unspecified intracranial injury with loss of consciousness of unspecified duration, initial encounter: Secondary | ICD-10-CM | POA: Diagnosis present

## 2017-10-18 ENCOUNTER — Encounter: Payer: Self-pay | Admitting: Family Medicine

## 2017-10-18 ENCOUNTER — Ambulatory Visit: Payer: Medicare Other | Admitting: Family Medicine

## 2017-10-18 VITALS — BP 158/90 | HR 96 | Temp 97.8°F | Resp 16 | Ht 74.0 in | Wt 211.7 lb

## 2017-10-18 DIAGNOSIS — I700 Atherosclerosis of aorta: Secondary | ICD-10-CM | POA: Diagnosis not present

## 2017-10-18 DIAGNOSIS — J432 Centrilobular emphysema: Secondary | ICD-10-CM | POA: Diagnosis not present

## 2017-10-18 DIAGNOSIS — I10 Essential (primary) hypertension: Secondary | ICD-10-CM | POA: Diagnosis not present

## 2017-10-18 DIAGNOSIS — K8020 Calculus of gallbladder without cholecystitis without obstruction: Secondary | ICD-10-CM

## 2017-10-18 DIAGNOSIS — Z716 Tobacco abuse counseling: Secondary | ICD-10-CM

## 2017-10-18 DIAGNOSIS — I2510 Atherosclerotic heart disease of native coronary artery without angina pectoris: Secondary | ICD-10-CM | POA: Diagnosis not present

## 2017-10-18 NOTE — Patient Instructions (Signed)

## 2017-10-18 NOTE — Progress Notes (Signed)
Name: [name]   MRN: [id number]    DOB: [birth year]   Date:10/18/2017       Progress Note  Subjective  Chief Complaint  Chief Complaint  Patient presents with  . Emphysema    CT Scan and Lab work  . Hyperlipidemia    Doing well with Crestor    HPI  ASCVD/aorta atherosclerosis: he denies chest pain, palpitation or SOB, no decrease in exercise tolerance. He is on high dose of Crestor and denies side effects - started 09/2017.   Emphysema: discussed CT report , he has mild cough, needs to clear his lungs usually in am, but no decrease in exercise. He smokes one pack daily for the past 20 years.   Cholelithiasis: found on CT, his wife is here with him today, she states he had right side pain that radiates to his back about one month ago, lasted a couple of days, not sure if associated with vomiting. Discussed episodes and when to have surgery  Patient Active Problem List   Diagnosis Date Noted  . Thoracic aorta atherosclerosis ([place]) 10/03/2017  . Cholelithiasis 10/03/2017  . Coronary artery calcification 10/03/2017  . Emphysema lung ([place]) 10/03/2017  . Depression 10/16/2015  . Chronic cervical radiculopathy 02/21/2015  . 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 ASCVD (arteriosclerotic cardiovascular disease) 01/18/2014  . Traumatic brain injury ([place]) 01/18/2014  . Chronic pain 01/18/2014  . Substance abuse ([place]) 12/12/2013  . Substance induced mood disorder ([place]) 12/12/2013  . 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 Osteoarthritis of left hip 11/15/2009  . GAD (generalized anxiety disorder) 05/22/2009  . MEMORY LOSS 05/22/2009  . Hyperlipidemia 12/03/2008  . TOBACCO USE 08/29/2008  . Essential hypertension 07/04/2008  . History of pulmonary embolus (PE) 07/04/2008  . INSOMNIA UNSPECIFIED 07/04/2008    Past Surgical History:  Procedure Laterality Date  . APPENDECTOMY    . BACK SURGERY    . CARDIAC CATHETERIZATION   8/08   90% focal R coronary, 80% R small obtuse marginal branch  . CERVICAL SPINE SURGERY  01/2009  . CORONARY STENT PLACEMENT  05/2007   right main  . FRACTURE SURGERY  9/09   orif, screw, syndesmotic screw  . splenic cautery  0/09  . THORACENTESIS  9/09   chest tube, ICU    Family History  Problem Relation Age of Onset  . Dementia Mother   . Congestive Heart Failure Mother   . Cancer Maternal Grandmother   . Heart attack Maternal Grandfather     Social History   Socioeconomic History  . Marital status: Married    Spouse name: Not on file  . Number of children: Not on file  . Years of education: Not on file  . Highest education level: Not on file  Social Needs  . Financial resource strain: Not on file  . Food insecurity - worry: Not on file  . Food insecurity - inability: Not on file  . Transportation needs - medical: Not on file  . Transportation needs - non-medical: Not on file  Occupational History  . Not on file  Tobacco Use  . Smoking status: Current Every Day Smoker    Packs/day: 0.75    Years: 20.00    Pack years: 15.00    Types: Cigarettes    Start date: 10/18/1997  . Smokeless tobacco: Never Used  Substance and Sexual Activity  . Alcohol use: No  . Drug use: No  . Sexual activity: Yes  Partners: Female    Birth control/protection: None  Other Topics Concern  . Not on file  Social History Narrative  . Not on file     Current Outpatient Medications:  .  acetaminophen (TYLENOL) 500 MG tablet, Take 1 tablet (500 mg total) by mouth every 6 (six) hours as needed., Disp: 120 tablet, Rfl: 0 .  aspirin EC 325 MG tablet, Take 1 tablet (325 mg total) by mouth daily., Disp: 30 tablet, Rfl: 0 .  citalopram (CELEXA) 20 MG tablet, Take 1 tablet (20 mg total) daily by mouth., Disp: 90 tablet, Rfl: 0 .  metoprolol succinate (TOPROL-XL) 100 MG 24 hr tablet, Take 1 tablet (100 mg total) daily by mouth., Disp: 90 tablet, Rfl: 0 .  rosuvastatin (CRESTOR) 40 MG  tablet, Take 1 tablet (40 mg total) by mouth daily., Disp: 90 tablet, Rfl: 3  Allergies  Allergen Reactions  . No Known Allergies      ROS  Constitutional: Negative for fever or weight change.  Respiratory: Negative for cough and shortness of breath.   Cardiovascular: Negative for chest pain or palpitations.  Gastrointestinal: Negative for abdominal pain, no bowel changes.  Musculoskeletal: Negative for gait problem or joint swelling.  Skin: Negative for rash.  Neurological: Negative for dizziness or headache.  No other specific complaints in a complete review of systems (except as listed in HPI above).  Objective  Vitals:   10/18/17 1203  BP: (!) 158/90  Pulse: 96  Resp: 16  Temp: 97.8 F (36.6 C)  TempSrc: Oral  SpO2: 98%  Weight: 211 lb 11.2 oz (96 kg)  Height: 6\' 2"  (1.88 m)    Body mass index is 27.18 kg/m.  Physical Exam  Constitutional: Patient appears well-developed and well-nourished. Overweight. No distress.  HEENT: head atraumatic, normocephalic, pupils equal and reactive to light, neck supple, throat within normal limits Cardiovascular: Normal rate, regular rhythm and normal heart sounds.  No murmur heard. No BLE edema. Pulmonary/Chest: Effort normal and breath sounds normal. No respiratory distress. Abdominal: Soft.  There is no tenderness. Psychiatric: Patient has a normal mood and affect. behavior is normal. Judgment and thought content normal.  Recent Results (from the past 2160 hour(s))  COMPLETE METABOLIC PANEL WITH GFR     Status: Abnormal   Collection Time: 09/17/17  4:01 PM  Result Value Ref Range   Glucose, Bld 111 (H) 65 - 99 mg/dL    Comment: .            Fasting reference interval . For someone without known diabetes, a glucose value between 100 and 125 mg/dL is consistent with prediabetes and should be confirmed with a follow-up test. .    BUN 14 7 - 25 mg/dL   Creat 0.87 0.70 - 1.33 mg/dL    Comment: For patients >49 years of  age, the reference limit for Creatinine is approximately 13% higher for people identified as African-American. .    GFR, Est Non African American 96 > OR = 60 mL/min/1.27m2   GFR, Est African American 112 > OR = 60 mL/min/1.87m2   BUN/Creatinine Ratio NOT APPLICABLE 6 - 22 (calc)   Sodium 138 135 - 146 mmol/L   Potassium 4.0 3.5 - 5.3 mmol/L   Chloride 103 98 - 110 mmol/L   CO2 27 20 - 32 mmol/L   Calcium 9.4 8.6 - 10.3 mg/dL   Total Protein 7.1 6.1 - 8.1 g/dL   Albumin 4.5 3.6 - 5.1 g/dL   Globulin 2.6 1.9 -  3.7 g/dL (calc)   AG Ratio 1.7 1.0 - 2.5 (calc)   Total Bilirubin 0.5 0.2 - 1.2 mg/dL   Alkaline phosphatase (APISO) 72 40 - 115 U/L   AST 15 10 - 35 U/L   ALT 15 9 - 46 U/L  CBC with Differential/Platelet     Status: None   Collection Time: 09/17/17  4:01 PM  Result Value Ref Range   WBC 9.5 3.8 - 10.8 Thousand/uL   RBC 4.41 4.20 - 5.80 Million/uL   Hemoglobin 14.2 13.2 - 17.1 g/dL   HCT 40.5 38.5 - 50.0 %   MCV 91.8 80.0 - 100.0 fL   MCH 32.2 27.0 - 33.0 pg   MCHC 35.1 32.0 - 36.0 g/dL   RDW 12.5 11.0 - 15.0 %   Platelets 347 140 - 400 Thousand/uL   MPV 9.2 7.5 - 12.5 fL   Neutro Abs 5,320 1,500 - 7,800 cells/uL   Lymphs Abs 3,221 850 - 3,900 cells/uL   WBC mixed population 770 200 - 950 cells/uL   Eosinophils Absolute 162 15 - 500 cells/uL   Basophils Absolute 29 0 - 200 cells/uL   Neutrophils Relative % 56 %   Total Lymphocyte 33.9 %   Monocytes Relative 8.1 %   Eosinophils Relative 1.7 %   Basophils Relative 0.3 %  Lipid panel     Status: Abnormal   Collection Time: 09/17/17  4:01 PM  Result Value Ref Range   Cholesterol 192 <200 mg/dL   HDL 20 (L) >40 mg/dL   Triglycerides 161 (H) <150 mg/dL   LDL Cholesterol (Calc) 142 (H) mg/dL (calc)    Comment: Reference range: <100 . Desirable range <100 mg/dL for primary prevention;   <70 mg/dL for patients with CHD or diabetic patients  with > or = 2 CHD risk factors. Marland Kitchen LDL-C is now calculated using the  Martin-Hopkins  calculation, which is a validated novel method providing  better accuracy than the Friedewald equation in the  estimation of LDL-C.  Cresenciano Genre et al. Annamaria Helling. 9702;637(85): 2061-2068  (http://education.[facility].com/faq/FAQ164)    Total CHOL/HDL Ratio 9.6 (H) <5.0 (calc)   Non-HDL Cholesterol (Calc) 172 (H) <130 mg/dL (calc)    Comment: For patients with diabetes plus 1 major ASCVD risk  factor, treating to a non-HDL-C goal of <100 mg/dL  (LDL-C of <70 mg/dL) is considered a therapeutic  option.      PHQ2/9: Depression screen [facility] 2/9 08/20/2017 07/21/2017 06/21/2017 04/28/2017 04/23/2017  Decreased Interest 0 0 0 0 0  Down, Depressed, Hopeless 0 0 0 0 0  PHQ - 2 Score 0 0 0 0 0     Fall Risk: Fall Risk  09/17/2017 08/20/2017 07/21/2017 06/21/2017 04/28/2017  Falls in the past year? No No No No No     Assessment & Plan  1. Atherosclerosis of aorta ([place])  Discussed results  2. Essential hypertension  BP is elevated today, it was at goal last time, he has been out  of his adderal for the past 6 weeks and thinks it is causing his bp to go up  3. ASCVD (arteriosclerotic cardiovascular disease)  S/p stent years ago, on higher dose of statin therapy   4. Centrilobular emphysema ([place])  Refuses spirometry or medication at this time  5. Encounter for tobacco use cessation counseling  Refuses at this time

## 2017-10-21 ENCOUNTER — Ambulatory Visit: Payer: Medicare Other | Admitting: Family Medicine

## 2017-10-25 ENCOUNTER — Ambulatory Visit: Payer: Self-pay | Admitting: Cardiovascular Disease
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
## 2017-10-29 ENCOUNTER — Encounter: Payer: Self-pay | Admitting: Cardiovascular Disease

## 2017-10-29 ENCOUNTER — Ambulatory Visit (INDEPENDENT_AMBULATORY_CARE_PROVIDER_SITE_OTHER): Payer: Medicare Other | Admitting: Cardiovascular Disease

## 2017-10-29 VITALS — BP 128/70 | HR 61 | Ht 74.0 in | Wt 212.0 lb

## 2017-10-29 DIAGNOSIS — E782 Mixed hyperlipidemia: Secondary | ICD-10-CM | POA: Diagnosis not present

## 2017-10-29 DIAGNOSIS — I10 Essential (primary) hypertension: Secondary | ICD-10-CM | POA: Diagnosis not present

## 2017-10-29 DIAGNOSIS — G8929 Other chronic pain: Secondary | ICD-10-CM | POA: Diagnosis not present

## 2017-10-29 DIAGNOSIS — F17200 Nicotine dependence, unspecified, uncomplicated: Secondary | ICD-10-CM

## 2017-10-29 DIAGNOSIS — J439 Emphysema, unspecified: Secondary | ICD-10-CM

## 2017-10-29 DIAGNOSIS — I25118 Atherosclerotic heart disease of native coronary artery with other forms of angina pectoris: Secondary | ICD-10-CM | POA: Diagnosis not present

## 2017-10-29 DIAGNOSIS — I700 Atherosclerosis of aorta: Secondary | ICD-10-CM

## 2017-10-29 DIAGNOSIS — I2510 Atherosclerotic heart disease of native coronary artery without angina pectoris: Secondary | ICD-10-CM | POA: Insufficient documentation

## 2017-10-29 NOTE — Patient Instructions (Signed)

## 2017-11-07 ENCOUNTER — Encounter: Payer: Self-pay | Admitting: Psychology
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
## 2017-11-18 ENCOUNTER — Ambulatory Visit: Payer: Medicare Other | Admitting: Family Medicine

## 2017-12-14 ENCOUNTER — Encounter: Payer: Medicare Other | Attending: Psychology | Admitting: Psychology

## 2017-12-14 DIAGNOSIS — F419 Anxiety disorder, unspecified: Secondary | ICD-10-CM | POA: Insufficient documentation

## 2017-12-14 DIAGNOSIS — I2510 Atherosclerotic heart disease of native coronary artery without angina pectoris: Secondary | ICD-10-CM | POA: Insufficient documentation

## 2017-12-14 DIAGNOSIS — Z86718 Personal history of other venous thrombosis and embolism: Secondary | ICD-10-CM | POA: Insufficient documentation

## 2017-12-14 DIAGNOSIS — F339 Major depressive disorder, recurrent, unspecified: Secondary | ICD-10-CM | POA: Insufficient documentation

## 2017-12-14 DIAGNOSIS — S069X9A Unspecified intracranial injury with loss of consciousness of unspecified duration, initial encounter: Secondary | ICD-10-CM | POA: Insufficient documentation

## 2017-12-14 DIAGNOSIS — I10 Essential (primary) hypertension: Secondary | ICD-10-CM | POA: Insufficient documentation

## 2017-12-14 DIAGNOSIS — E785 Hyperlipidemia, unspecified: Secondary | ICD-10-CM | POA: Insufficient documentation

## 2017-12-17 ENCOUNTER — Ambulatory Visit: Payer: Self-pay | Admitting: Psychology

## 2017-12-21 ENCOUNTER — Ambulatory Visit: Payer: Self-pay | Admitting: Psychology

## 2017-12-23 ENCOUNTER — Ambulatory Visit: Payer: Self-pay | Admitting: Psychology

## 2018-01-03 ENCOUNTER — Ambulatory Visit: Payer: Self-pay | Admitting: *Deleted

## 2018-01-03 ENCOUNTER — Ambulatory Visit: Payer: Medicare Other | Admitting: Family Medicine

## 2018-01-03 NOTE — Telephone Encounter (Signed)
No  Availability at [facility] . Patient advised to go to an urgent care. Pt states he will go to [facility]

## 2018-01-03 NOTE — Telephone Encounter (Signed)
duplicate  Triage  Encounter entered please disregard this  Encounter

## 2018-01-03 NOTE — Telephone Encounter (Signed)
  Reason for Disposition . MODERATE pain (e.g., interferes with normal activities or awakens from sleep)  Answer Assessment - Initial Assessment Questions 1. LOCATION: "Where does it hurt?" (e.g., left, right)      l  Flank    2. ONSET: "When did the pain start?"     2  Days ago 3. SEVERITY: "How bad is the pain?" (e.g., Scale 1-10; mild, moderate, or severe)   - MILD (1-3): doesn't interfere with normal activities    - MODERATE (4-7): interferes with normal activities or awakens from sleep    - SEVERE (8-10): excruciating pain and patient unable to do normal activities (stays in bed)        Moderate    4. PATTERN: "Does the pain come and go, or is it constant?"       Comes  And  Goes   5. CAUSE: "What do you think is causing the pain?"        Unknown  Denies  Any   Injury   6. OTHER SYMPTOMS:  "Do you have any other symptoms?" (e.g., fever, abdominal pain, vomiting, leg weakness, burning with urination, blood in urine)          No  Urinary frequency  Pain is  Worse  On  Certain  Movements     7. PREGNANCY:  "Is there any chance you are pregnant?" "When was your last menstrual period?"     n/a  Protocols used: FLANK PAIN-A-AH

## 2018-01-04 ENCOUNTER — Encounter (HOSPITAL_COMMUNITY): Payer: Self-pay

## 2018-01-04 ENCOUNTER — Ambulatory Visit (HOSPITAL_COMMUNITY)
Admission: EM | Admit: 2018-01-04 | Discharge: 2018-01-04 | Disposition: A | Discharge status: Home / Self Care | Payer: Medicare Other | Attending: Family Medicine | Admitting: Family Medicine

## 2018-01-04 DIAGNOSIS — S39012A Strain of muscle, fascia and tendon of lower back, initial encounter: Secondary | ICD-10-CM

## 2018-01-04 LAB — POCT URINALYSIS DIP (DEVICE)
BILIRUBIN URINE: NEGATIVE
Glucose, UA: NEGATIVE mg/dL
HGB URINE DIPSTICK: NEGATIVE
KETONES UR: NEGATIVE mg/dL
LEUKOCYTES UA: NEGATIVE
Nitrite: NEGATIVE
Protein, ur: NEGATIVE mg/dL
SPECIFIC GRAVITY, URINE: 1.015 (ref 1.005–1.030)
Urobilinogen, UA: 0.2 mg/dL (ref 0.0–1.0)
pH: 7.5 (ref 5.0–8.0)

## 2018-01-04 MED ORDER — TIZANIDINE HCL 4 MG PO TABS: 4.0000 mg | ORAL_TABLET | Freq: Four times a day (QID) | ORAL | 0 refills | Status: DC | PRN

## 2018-01-04 MED ORDER — IBUPROFEN 800 MG PO TABS: 800.0000 mg | ORAL_TABLET | Freq: Three times a day (TID) | ORAL | 0 refills | Status: DC

## 2018-01-04 NOTE — Discharge Instructions (Signed)
Take the ibuprofen as needed Take with food Take the tizanidine as needed muscle relaxer May cause drowsiness Return as needed

## 2018-01-04 NOTE — ED Provider Notes (Signed)
[place]    CSN: [id number] Arrival date & time: 01/04/18  0957     History   Chief Complaint Chief Complaint  Patient presents with  . Flank Pain    HPI [name] is a 57 y.o. male.   HPI Mr. Evelene Croon is here for 3 days of left flank pain.  No nausea or vomiting.  No fever chills.  No history of kidney stone.  No trauma or injury.  It came suddenly while he was walking up steps.  Is made better by standing.  Is worse with movement.  No bowel or bladder complaints.  He does have a history of back surgery many years ago in the lumbar region.  This pain is higher than that in his left flank.  It is continuous.  Is made better with warmth. Past Medical History:  Diagnosis Date  . CAD (coronary artery disease) 8/08   cath, bare metal  . DVT (deep venous thrombosis) ([place]) 9/09   left leg found after ortho surgery  . HTN (hypertension)   . Hyperlipidemia   . Hypogonadism male   . Liver laceration 9/09  . Pneumothorax   . Pulmonary embolus ([place]) 9/09  . 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 Rib fractures 9/09   x 3  . Splenic laceration 9/09    Patient Active Problem List   Diagnosis Date Noted  . CAD (coronary artery disease), native coronary artery 10/29/2017  . Thoracic aorta atherosclerosis ([place]) 10/03/2017  . Cholelithiasis 10/03/2017  . Coronary artery calcification 10/03/2017  . Emphysema lung ([place]) 10/03/2017  . Depression 10/16/2015  . Chronic cervical radiculopathy 02/21/2015  . 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 ASCVD (arteriosclerotic cardiovascular disease) 01/18/2014  . Traumatic brain injury ([place]) 01/18/2014  . Chronic pain 01/18/2014  . Substance abuse ([place]) 12/12/2013  . Substance induced mood disorder ([place]) 12/12/2013  . 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 Osteoarthritis of left hip 11/15/2009  . GAD (generalized anxiety disorder) 05/22/2009  .  MEMORY LOSS 05/22/2009  . Hyperlipidemia 12/03/2008  . TOBACCO USE 08/29/2008  . Essential hypertension 07/04/2008  . History of pulmonary embolus (PE) 07/04/2008  . INSOMNIA UNSPECIFIED 07/04/2008    Past Surgical History:  Procedure Laterality Date  . APPENDECTOMY    . BACK SURGERY    . CARDIAC CATHETERIZATION  8/08   90% focal R coronary, 80% R small obtuse marginal branch  . CERVICAL SPINE SURGERY  01/2009  . CORONARY STENT PLACEMENT  05/2007   right main  . FRACTURE SURGERY  9/09   orif, screw, syndesmotic screw  . splenic cautery  0/09  . THORACENTESIS  9/09   chest tube, ICU       Home Medications    Prior to Admission medications   Medication Sig Start Date End Date Taking? Authorizing Provider  aspirin EC 325 MG tablet Take 1 tablet (325 mg total) by mouth daily. 09/17/17  Yes Sowles, Drue Stager, MD  citalopram (CELEXA) 20 MG tablet Take 1 tablet (20 mg total) daily by mouth. 07/21/17  Yes Roselee Nova, MD  metoprolol succinate (TOPROL-XL) 100 MG 24 hr tablet Take 1 tablet (100 mg total) daily by mouth. 07/21/17  Yes Roselee Nova, MD  rosuvastatin (CRESTOR) 40 MG tablet Take 1 tablet (40 mg total) by mouth daily. 09/20/17  Yes Sowles, Drue Stager, MD  acetaminophen (TYLENOL) 500 MG tablet Take 1 tablet (500 mg total) by  mouth every 6 (six) hours as needed. 09/17/17   Steele Sizer, MD  ibuprofen (ADVIL,MOTRIN) 800 MG tablet Take 1 tablet (800 mg total) by mouth 3 (three) times daily. 01/04/18   Raylene Everts, MD  tiZANidine (ZANAFLEX) 4 MG tablet Take 1 tablet (4 mg total) by mouth every 6 (six) hours as needed for muscle spasms. 01/04/18   Raylene Everts, MD    Family History Family History  Problem Relation Age of Onset  . Dementia Mother   . Congestive Heart Failure Mother   . Cancer Maternal Grandmother   . Heart attack Maternal Grandfather     Social History Social History   Tobacco Use  . Smoking status: Current Every Day Smoker     Packs/day: 0.75    Years: 20.00    Pack years: 15.00    Types: Cigarettes    Start date: 10/18/1997  . Smokeless tobacco: Never Used  Substance Use Topics  . Alcohol use: No  . Drug use: No     Allergies   No known allergies   Review of Systems Review of Systems  Constitutional: Negative for activity change, appetite change, chills and fever.  HENT: Negative.  Negative for congestion.   Eyes: Negative.  Negative for visual disturbance.  Respiratory: Negative.  Negative for cough and shortness of breath.   Cardiovascular: Negative for chest pain and palpitations.  Gastrointestinal: Negative for abdominal pain and constipation.  Genitourinary: Positive for flank pain. Negative for difficulty urinating and hematuria.  Musculoskeletal: Positive for arthralgias, back pain and myalgias.  Neurological: Negative for dizziness and headaches.  Psychiatric/Behavioral: Negative.  Negative for sleep disturbance.     Physical Exam Triage Vital Signs ED Triage Vitals  Enc Vitals Group     BP 01/04/18 1011 (!) 153/96     Pulse Rate 01/04/18 1011 80     Resp 01/04/18 1011 18     Temp 01/04/18 1011 (!) 96.9 F (36.1 C)     Temp src --      SpO2 01/04/18 1011 98 %     Weight 01/04/18 1012 212 lb (96.2 kg)     Height --      Head Circumference --      Peak Flow --      Pain Score 01/04/18 1011 5     Pain Loc --      Pain Edu? --      Excl. in [place]? --    No data found.  Updated Vital Signs BP (!) 153/96   Pulse 80   Temp (!) 96.9 F (36.1 C)   Resp 18   Wt 212 lb (96.2 kg)   SpO2 98%   BMI 27.22 kg/m    Physical Exam  Constitutional: He is oriented to person, place, and time. He appears well-developed and well-nourished.  HENT:  Head: Normocephalic and atraumatic.  Right Ear: External ear normal.  Left Ear: External ear normal.  Mouth/Throat: Oropharynx is clear and moist.  Eyes: Pupils are equal, round, and reactive to light. Conjunctivae are normal.  Neck: Normal  range of motion. Neck supple. No thyromegaly present.  Cardiovascular: Normal rate, regular rhythm and normal heart sounds.  Pulmonary/Chest: Effort normal and breath sounds normal. No respiratory distress.  Abdominal: Soft. Bowel sounds are normal.  No tenderness to deep palpation in the abdomen.  No organomegaly.  Musculoskeletal: Normal range of motion. He exhibits no edema.  Tenderness to palpation over left upper lumbar muscle.  No CVA tenderness to  tapping.  Lymphadenopathy:    He has no cervical adenopathy.  Neurological: He is alert and oriented to person, place, and time.  Gait normal  Skin: Skin is warm and dry.  Psychiatric: He has a normal mood and affect. His behavior is normal. Thought content normal.  Nursing note and vitals reviewed.    UC Treatments / Results  Labs (all labs ordered are listed, but only abnormal results are displayed) Labs Reviewed  POCT URINALYSIS DIP (DEVICE)    Procedures Procedures (including critical care time)  Medications Ordered in UC Medications - No data to display  Initial Impression / Assessment and Plan / UC Course  I have reviewed the triage vital signs and the nursing notes.  Pertinent labs & imaging results that were available during my care of the patient were reviewed by me and considered in my medical decision making (see chart for details).     Patient is a history of narcotic addiction and is clear that he does not want pain medication.  He is happy with ibuprofen 800 mg and a muscle relaxer.  We discussed ice and heat, activity limitations, and the natural course of muscular injuries.  I think the likelihood of kidney stone is low with no CVA tenderness, abdominal tenderness, urinary complaint, or hematuria. Final Clinical Impressions(s) / UC Diagnoses   Final diagnoses:  Lumbar strain, initial encounter     Discharge Instructions     Take the ibuprofen as needed Take with food Take the tizanidine as needed  muscle relaxer May cause drowsiness Return as needed   ED Prescriptions    Medication Sig Dispense Auth. Provider   ibuprofen (ADVIL,MOTRIN) 800 MG tablet Take 1 tablet (800 mg total) by mouth 3 (three) times daily. 30 tablet Raylene Everts, MD   tiZANidine (ZANAFLEX) 4 MG tablet Take 1 tablet (4 mg total) by mouth every 6 (six) hours as needed for muscle spasms. 30 tablet Raylene Everts, MD     Controlled Substance Prescriptions [place] Controlled Substance Registry consulted? Not indicated  Raylene Everts, MD 01/04/18 1044

## 2018-01-04 NOTE — ED Triage Notes (Signed)
Pt complains of left lower flank pain x 3 days.  Denies any other symptoms.

## 2018-01-25 ENCOUNTER — Emergency Department (HOSPITAL_COMMUNITY): Payer: Medicare Other

## 2018-01-25 ENCOUNTER — Encounter (HOSPITAL_COMMUNITY): Payer: Self-pay

## 2018-01-25 ENCOUNTER — Emergency Department (HOSPITAL_COMMUNITY)
Admission: EM | Admit: 2018-01-25 | Discharge: 2018-01-25 | Disposition: A | Discharge status: Home / Self Care | Payer: Medicare Other | Attending: Emergency Medicine | Admitting: Emergency Medicine

## 2018-01-25 DIAGNOSIS — Z7982 Long term (current) use of aspirin: Secondary | ICD-10-CM | POA: Insufficient documentation

## 2018-01-25 DIAGNOSIS — I10 Essential (primary) hypertension: Secondary | ICD-10-CM | POA: Diagnosis not present

## 2018-01-25 DIAGNOSIS — I2510 Atherosclerotic heart disease of native coronary artery without angina pectoris: Secondary | ICD-10-CM | POA: Insufficient documentation

## 2018-01-25 DIAGNOSIS — Z955 Presence of coronary angioplasty implant and graft: Secondary | ICD-10-CM | POA: Insufficient documentation

## 2018-01-25 DIAGNOSIS — Z79899 Other long term (current) drug therapy: Secondary | ICD-10-CM | POA: Diagnosis not present

## 2018-01-25 DIAGNOSIS — F17210 Nicotine dependence, cigarettes, uncomplicated: Secondary | ICD-10-CM | POA: Diagnosis not present

## 2018-01-25 DIAGNOSIS — R1031 Right lower quadrant pain: Secondary | ICD-10-CM | POA: Diagnosis present

## 2018-01-25 DIAGNOSIS — R109 Unspecified abdominal pain: Secondary | ICD-10-CM

## 2018-01-25 LAB — CBC WITH DIFFERENTIAL/PLATELET
BASOS ABS: 0 10*3/uL (ref 0.0–0.1)
BASOS PCT: 0 %
EOS PCT: 2 %
Eosinophils Absolute: 0.2 10*3/uL (ref 0.0–0.7)
HCT: 46.1 % (ref 39.0–52.0)
Hemoglobin: 15.6 g/dL (ref 13.0–17.0)
Lymphocytes Relative: 31 %
Lymphs Abs: 2.4 10*3/uL (ref 0.7–4.0)
MCH: 32.9 pg (ref 26.0–34.0)
MCHC: 33.8 g/dL (ref 30.0–36.0)
MCV: 97.3 fL (ref 78.0–100.0)
MONO ABS: 0.5 10*3/uL (ref 0.1–1.0)
Monocytes Relative: 6 %
Neutro Abs: 4.7 10*3/uL (ref 1.7–7.7)
Neutrophils Relative %: 61 %
PLATELETS: 327 10*3/uL (ref 150–400)
RBC: 4.74 MIL/uL (ref 4.22–5.81)
RDW: 12.2 % (ref 11.5–15.5)
WBC: 7.8 10*3/uL (ref 4.0–10.5)

## 2018-01-25 LAB — COMPREHENSIVE METABOLIC PANEL
ALBUMIN: 4.2 g/dL (ref 3.5–5.0)
ALT: 30 U/L (ref 17–63)
AST: 31 U/L (ref 15–41)
Alkaline Phosphatase: 65 U/L (ref 38–126)
Anion gap: 9 (ref 5–15)
BUN: 13 mg/dL (ref 6–20)
CHLORIDE: 103 mmol/L (ref 101–111)
CO2: 24 mmol/L (ref 22–32)
Calcium: 9.1 mg/dL (ref 8.9–10.3)
Creatinine, Ser: 0.8 mg/dL (ref 0.61–1.24)
GFR calc Af Amer: >60 mL/min (ref >60)
GLUCOSE: 95 mg/dL (ref 65–99)
Potassium: 4.5 mmol/L (ref 3.5–5.1)
SODIUM: 136 mmol/L (ref 135–145)
Total Bilirubin: 1.1 mg/dL (ref 0.3–1.2)
Total Protein: 7.4 g/dL (ref 6.5–8.1)

## 2018-01-25 LAB — URINALYSIS, ROUTINE W REFLEX MICROSCOPIC
Bilirubin Urine: NEGATIVE
GLUCOSE, UA: NEGATIVE mg/dL
Hgb urine dipstick: NEGATIVE
KETONES UR: NEGATIVE mg/dL
Leukocytes, UA: NEGATIVE
Nitrite: NEGATIVE
PH: 8 (ref 5.0–8.0)
PROTEIN: NEGATIVE mg/dL
Specific Gravity, Urine: 1.013 (ref 1.005–1.030)

## 2018-01-25 MED ORDER — ONDANSETRON HCL 4 MG/2ML IJ SOLN
4.0000 mg | Freq: Once | INTRAMUSCULAR | Status: AC
  Administered 2018-01-25: 4 mg via INTRAVENOUS
  Filled 2018-01-25: qty 2

## 2018-01-25 MED ORDER — HYDROMORPHONE HCL 1 MG/ML IJ SOLN
1.0000 mg | Freq: Once | INTRAMUSCULAR | Status: AC
  Administered 2018-01-25: 1 mg via INTRAVENOUS
  Filled 2018-01-25: qty 1

## 2018-01-25 MED ORDER — OXYCODONE-ACETAMINOPHEN 5-325 MG PO TABS: 1.0000 | ORAL_TABLET | Freq: Four times a day (QID) | ORAL | 0 refills | Status: DC | PRN

## 2018-01-25 MED ORDER — IOPAMIDOL (ISOVUE-300) INJECTION 61%
100.0000 mL | Freq: Once | INTRAVENOUS | Status: AC | PRN
  Administered 2018-01-25: 100 mL via INTRAVENOUS

## 2018-01-25 NOTE — Discharge Instructions (Addendum)
Follow-up with your family doctor next week for recheck. return if any problems

## 2018-01-25 NOTE — ED Triage Notes (Signed)
Right flank pain for approx 5 days, pain had improved then the past 2 days it has increased. Pain has been constant last 2 days. Denies blood in urine

## 2018-01-25 NOTE — ED Notes (Signed)
Pt reports he was on BP medications in the past, but stopped them himself a year ago.

## 2018-01-25 NOTE — ED Notes (Signed)
Pt notified CT will perform test as soon as possible, CT states pt has 2 people ahead of him.

## 2018-01-25 NOTE — ED Provider Notes (Signed)
[name] Provider Note   CSN: [id number] Arrival date & time: 01/25/18  1232     History   Chief Complaint Chief Complaint  Patient presents with  . Flank Pain    HPI TERON BLAIS is a 57 y.o. male.  Patient complains of right flank pain and abdominal pain.  No fever chills  The history is provided by the patient.  Flank Pain  This is a new problem. The current episode started more than 2 days ago. The problem occurs constantly. The problem has not changed since onset.Associated symptoms include abdominal pain. Pertinent negatives include no chest pain and no headaches. Exacerbated by: Movement. Nothing relieves the symptoms. He has tried nothing for the symptoms. The treatment provided no relief.    Past Medical History:  Diagnosis Date  . CAD (coronary artery disease) 8/08   cath, bare metal  . DVT (deep venous thrombosis) ([place]) 9/09   left leg found after ortho surgery  . HTN (hypertension)   . Hyperlipidemia   . Hypogonadism male   . Liver laceration 9/09  . Pneumothorax   . Pulmonary embolus ([place]) 9/09  . 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 Rib fractures 9/09   x 3  . Splenic laceration 9/09    Patient Active Problem List   Diagnosis Date Noted  . CAD (coronary artery disease), native coronary artery 10/29/2017  . Thoracic aorta atherosclerosis ([place]) 10/03/2017  . Cholelithiasis 10/03/2017  . Coronary artery calcification 10/03/2017  . Emphysema lung ([place]) 10/03/2017  . Depression 10/16/2015  . Chronic cervical radiculopathy 02/21/2015  . 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 ASCVD (arteriosclerotic cardiovascular disease) 01/18/2014  . Traumatic brain injury ([place]) 01/18/2014  . Chronic pain 01/18/2014  . Substance abuse ([place]) 12/12/2013  . Substance induced mood disorder ([place]) 12/12/2013  . 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 Osteoarthritis of  left hip 11/15/2009  . GAD (generalized anxiety disorder) 05/22/2009  . MEMORY LOSS 05/22/2009  . Hyperlipidemia 12/03/2008  . TOBACCO USE 08/29/2008  . Essential hypertension 07/04/2008  . History of pulmonary embolus (PE) 07/04/2008  . INSOMNIA UNSPECIFIED 07/04/2008    Past Surgical History:  Procedure Laterality Date  . APPENDECTOMY    . BACK SURGERY    . CARDIAC CATHETERIZATION  8/08   90% focal R coronary, 80% R small obtuse marginal branch  . CERVICAL SPINE SURGERY  01/2009  . CORONARY STENT PLACEMENT  05/2007   right main  . FRACTURE SURGERY  9/09   orif, screw, syndesmotic screw  . splenic cautery  0/09  . THORACENTESIS  9/09   chest tube, ICU        Home Medications    Prior to Admission medications   Medication Sig Start Date End Date Taking? Authorizing Provider  acetaminophen (TYLENOL) 500 MG tablet Take 1 tablet (500 mg total) by mouth every 6 (six) hours as needed. 09/17/17  Yes Steele Sizer, MD  aspirin EC 325 MG tablet Take 1 tablet (325 mg total) by mouth daily. 09/17/17  Yes Sowles, Drue Stager, MD  citalopram (CELEXA) 20 MG tablet Take 1 tablet (20 mg total) daily by mouth. 07/21/17  Yes Roselee Nova, MD  ibuprofen (ADVIL,MOTRIN) 800 MG tablet Take 1 tablet (800 mg total) by mouth 3 (three) times daily. 01/04/18  Yes Raylene Everts, MD  rosuvastatin (CRESTOR) 40 MG tablet Take 1 tablet (40 mg total) by mouth daily. 09/20/17  Yes  Steele Sizer, MD  metoprolol succinate (TOPROL-XL) 100 MG 24 hr tablet Take 1 tablet (100 mg total) daily by mouth. Patient not taking: Reported on 01/25/2018 07/21/17   Roselee Nova, MD  oxyCODONE-acetaminophen (PERCOCET/ROXICET) 5-325 MG tablet Take 1 tablet by mouth every 6 (six) hours as needed. 01/25/18   Milton Ferguson, MD    Family History Family History  Problem Relation Age of Onset  . Dementia Mother   . Congestive Heart Failure Mother   . Cancer Maternal Grandmother   . Heart attack Maternal Grandfather       Social History Social History   Tobacco Use  . Smoking status: Current Every Day Smoker    Packs/day: 0.75    Years: 20.00    Pack years: 15.00    Types: Cigarettes    Start date: 10/18/1997  . Smokeless tobacco: Never Used  Substance Use Topics  . Alcohol use: Yes    Comment: occassionally  . Drug use: No     Allergies   No known allergies   Review of Systems Review of Systems  Constitutional: Negative for appetite change and fatigue.  HENT: Negative for congestion, ear discharge and sinus pressure.   Eyes: Negative for discharge.  Respiratory: Negative for cough.   Cardiovascular: Negative for chest pain.  Gastrointestinal: Positive for abdominal pain. Negative for diarrhea.  Genitourinary: Positive for flank pain. Negative for frequency and hematuria.  Musculoskeletal: Negative for back pain.  Skin: Negative for rash.  Neurological: Negative for seizures and headaches.  Psychiatric/Behavioral: Negative for hallucinations.     Physical Exam Updated Vital Signs BP (!) 135/101   Pulse 72   Temp 100.1 F (37.8 C) (Temporal)   Resp 18   Ht 6\' 2"  (1.88 m)   Wt 104.3 kg (230 lb)   SpO2 93%   BMI 29.53 kg/m   Physical Exam  Constitutional: He is oriented to person, place, and time. He appears well-developed.  HENT:  Head: Normocephalic.  Eyes: Conjunctivae and EOM are normal. No scleral icterus.  Neck: Neck supple. No thyromegaly present.  Cardiovascular: Normal rate and regular rhythm. Exam reveals no gallop and no friction rub.  No murmur heard. Pulmonary/Chest: No stridor. He has no wheezes. He has no rales. He exhibits no tenderness.  Abdominal: He exhibits no distension. There is tenderness. There is no rebound.  Genitourinary:  Genitourinary Comments: Tender right flank  Musculoskeletal: Normal range of motion. He exhibits no edema.  Lymphadenopathy:    He has no cervical adenopathy.  Neurological: He is oriented to person, place, and time. He  exhibits normal muscle tone. Coordination normal.  Skin: No rash noted. No erythema.  Psychiatric: He has a normal mood and affect. His behavior is normal.     ED Treatments / Results  Labs (all labs ordered are listed, but only abnormal results are displayed) Labs Reviewed  URINALYSIS, ROUTINE W REFLEX MICROSCOPIC  CBC WITH DIFFERENTIAL/PLATELET  COMPREHENSIVE METABOLIC PANEL    EKG None  Radiology Ct Abdomen Pelvis W Contrast  Result Date: 01/25/2018 CLINICAL DATA:  Right flank pain for 5 days. EXAM: CT ABDOMEN AND PELVIS WITH CONTRAST TECHNIQUE: Multidetector CT imaging of the abdomen and pelvis was performed using the standard protocol following bolus administration of intravenous contrast. CONTRAST:  166mL ISOVUE-300 IOPAMIDOL (ISOVUE-300) INJECTION 61% COMPARISON:  CT chest 11/09/2012 FINDINGS: Lower chest: Top-normal size heart with left main and three-vessel coronary arteriosclerosis. Hepatobiliary: Mild hepatic steatosis. Dependent calculi within a physiologically distended gallbladder. No wall thickening or  pericholecystic fluid. No biliary dilatation. Pancreas: Normal Spleen: Lobular appearance of the spleen with partially calcified cystic abnormality in the upper spleen measuring 3.6 x 2.1 x 4.5 cm that may reflect old posttraumatic change/acquired cyst or hematoma. Adrenals/Urinary Tract: Normal bilateral adrenal glands and kidneys. No obstructive uropathy. Urinary bladder is normal. Stomach/Bowel: Nondistended stomach with normal small bowel rotation. Increased colonic stool burden without obstruction or inflammation. Appendectomy by report. Vascular/Lymphatic: Moderate aortoiliac atherosclerosis. No aneurysm. No adenopathy. Reproductive: Normal size prostate and seminal vesicles. Other: No free air nor free fluid. Musculoskeletal: Mild multilevel degenerative disc disease of the lumbar spine, most prominent at L5-S1. No acute nor suspicious osseous abnormality. IMPRESSION: 1.  Increased colonic stool burden without bowel inflammation or obstruction. 2. No obstructive uropathy. 3. 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 This is chronic and stable in appearance dating back to 2014. Electronically Signed   By: Ashley Royalty M.D.   On: 01/25/2018 18:07    Procedures Procedures (including critical care time)  Medications Ordered in ED Medications  HYDROmorphone (DILAUDID) injection 1 mg (1 mg Intravenous Given 01/25/18 1333)  ondansetron (ZOFRAN) injection 4 mg (4 mg Intravenous Given 01/25/18 1333)  iopamidol (ISOVUE-300) 61 % injection 100 mL (100 mLs Intravenous Contrast Given 01/25/18 1646)  HYDROmorphone (DILAUDID) injection 1 mg (1 mg Intravenous Given 01/25/18 1707)     Initial Impression / Assessment and Plan / ED Course  I have reviewed the triage vital signs and the nursing notes.  Pertinent labs & imaging results that were available during my care of the patient were reviewed by me and considered in my medical decision making (see chart for details).     CBC chemistries urinalysis all unremarkable.  CT of the abdomen negative.  Patient with continued flank pain.  Suspect musculoskeletal injury.  Patient given some pain medicine he will follow-up with his PCP Final Clinical Impressions(s) / ED Diagnoses   Final diagnoses:  Flank pain    ED Discharge Orders        Ordered    oxyCODONE-acetaminophen (PERCOCET/ROXICET) 5-325 MG tablet  Every 6 hours PRN     01/25/18 1849       Milton Ferguson, MD 01/25/18 1856

## 2018-03-06 IMAGING — CT CT CHEST W/O CM
2 of 4 series · 15 of 36 positions shown, 18 images · non-contrast
Comparison: Chest CT 11/09/2012.

CLINICAL DATA: Patient with abnormal in scratch the patient with
abnormal pulmonary exam.

EXAM:
CT CHEST WITHOUT CONTRAST
TECHNIQUE: Multidetector CT imaging of the chest was performed following the
standard protocol without IV contrast.

[Series 4: chest 2.00 br40 s3 · axial · 0.75mm/px · z∈[-1519,-1243]mm · 12 of 164 slices shown, 15 images]
[im 13/164  mediastinal]
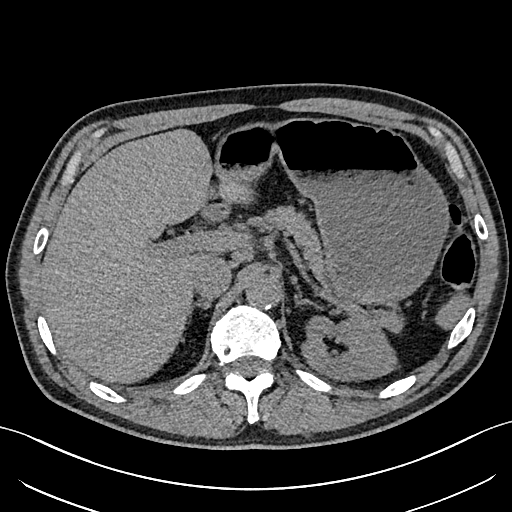
[im 13/164  lung]
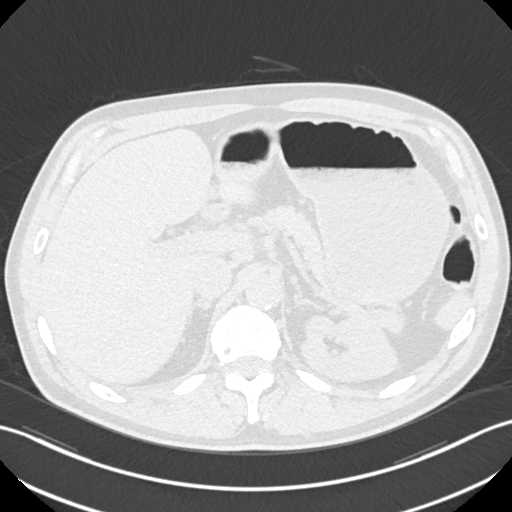
[im 26/164  lung]
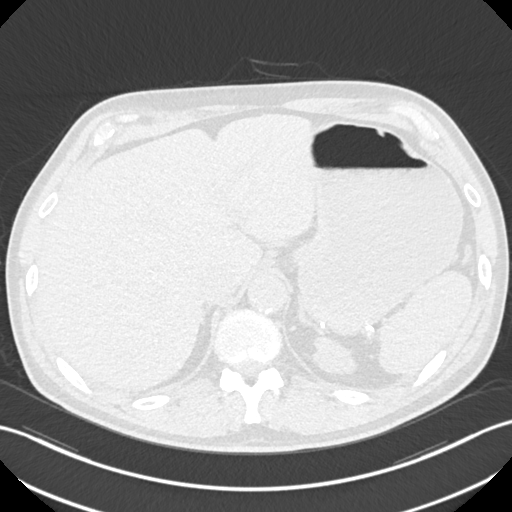
[im 38/164  lung]
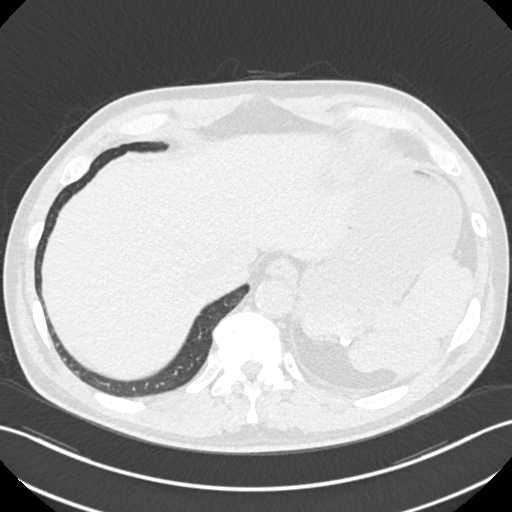
[im 51/164  lung]
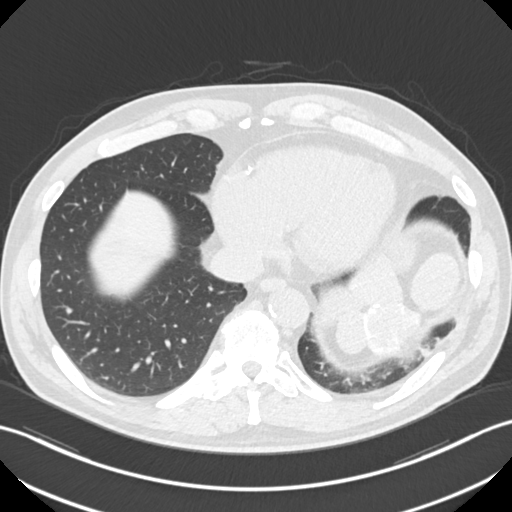
[im 63/164  mediastinal]
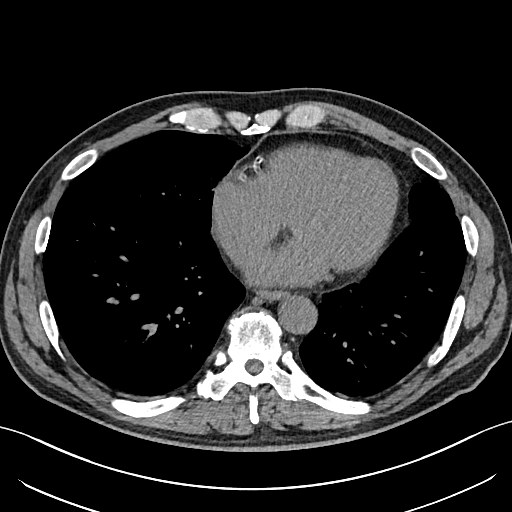
[im 63/164  lung]
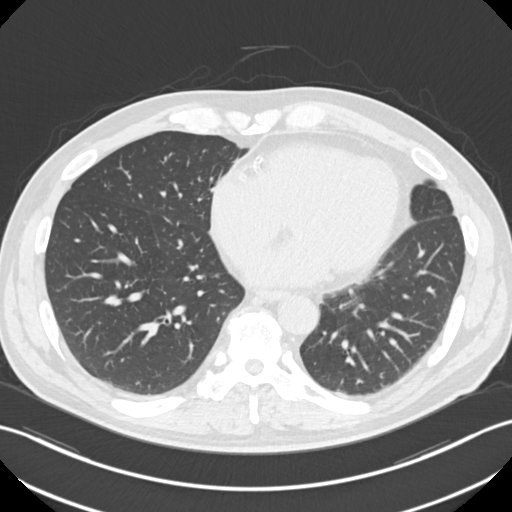
[im 76/164  lung]
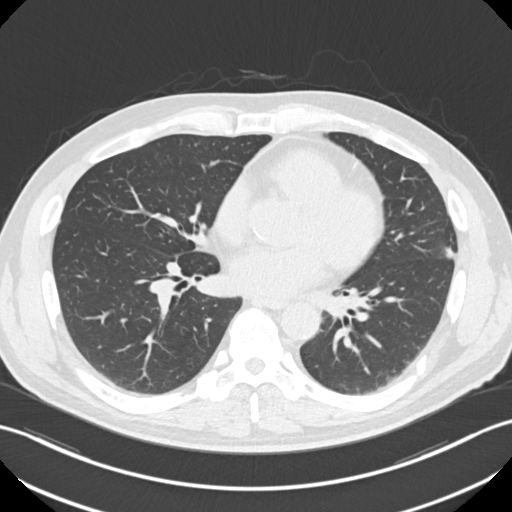
[im 88/164  lung]
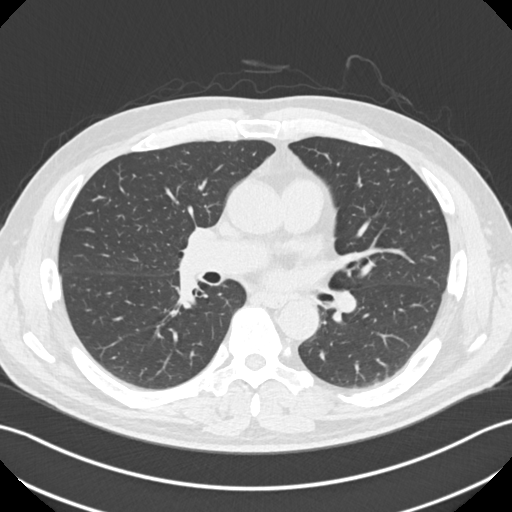
[im 101/164  lung]
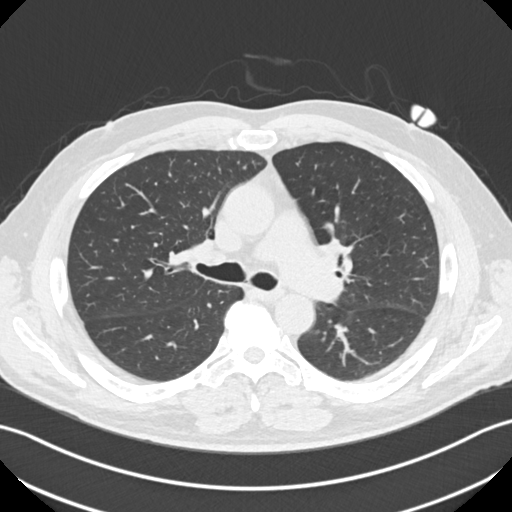
[im 113/164  mediastinal]
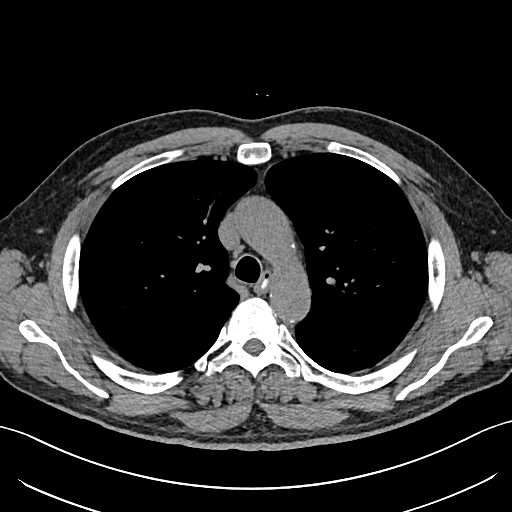
[im 113/164  lung]
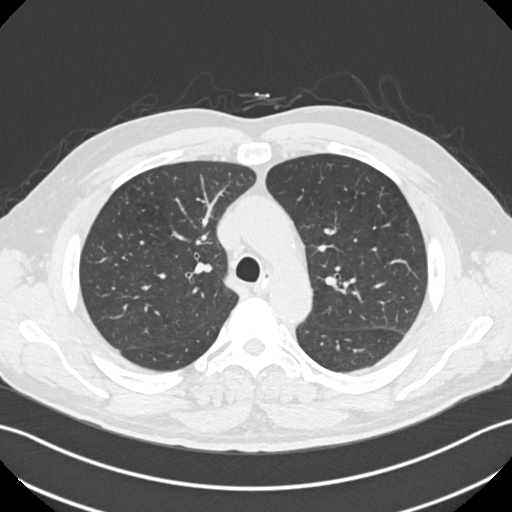
[im 126/164  lung]
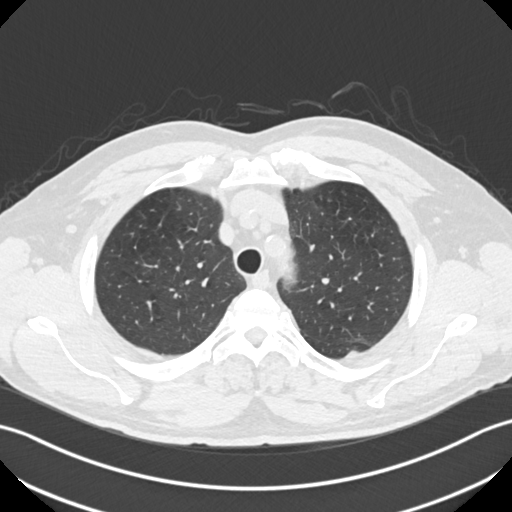
[im 138/164  lung]
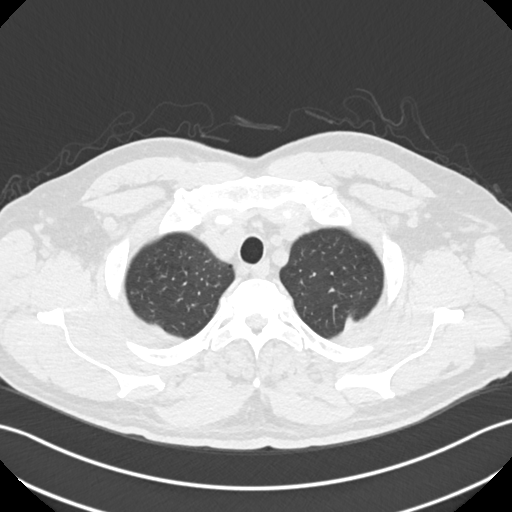
[im 151/164  lung]
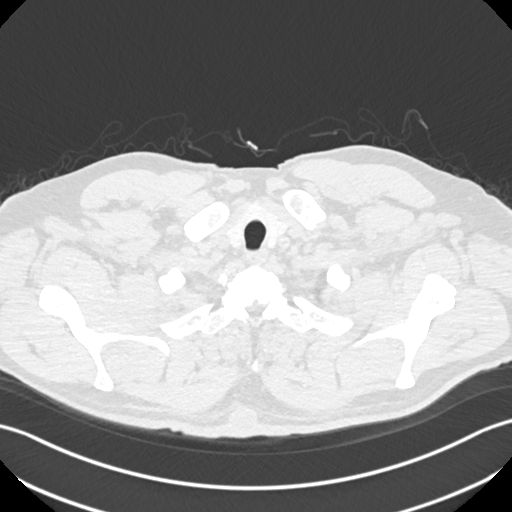

[Series 7: chest 2.00 br40 s3 cor · coronal · 0.64mm/px · 3 of 138 slices shown]
[im 28/138  lung]
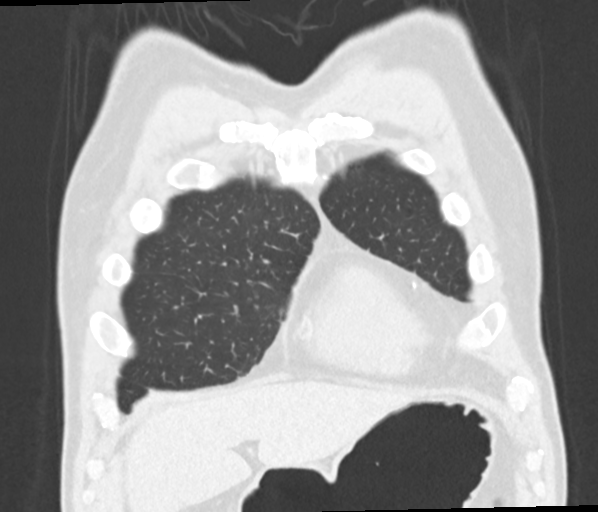
[im 55/138  lung]
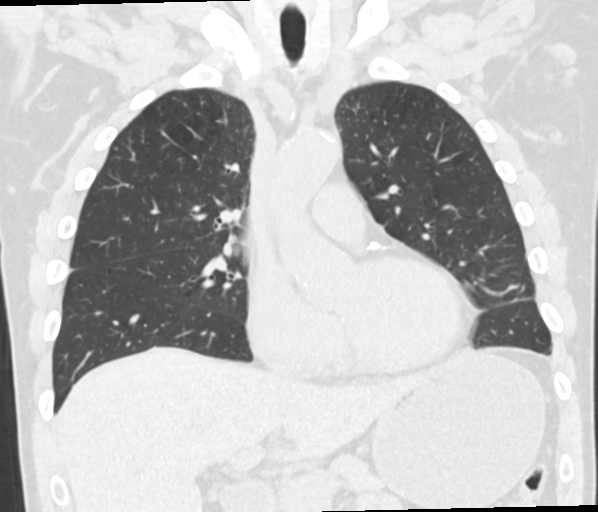
[im 83/138  lung]
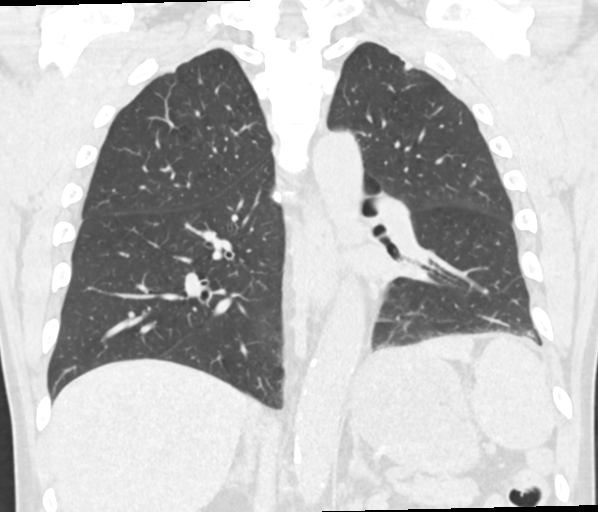

[15 of 36 positions shown; findings below may reference images not displayed]

FINDINGS: Cardiovascular: Heart is mildly enlarged. No pericardial effusion.
Coronary arterial vascular calcifications. Thoracic aortic vascular
calcifications.

Mediastinum/Nodes: No enlarged axillary, mediastinal or hilar
lymphadenopathy. Normal esophagus.

Lungs/Pleura: Central airways are patent. Dependent atelectasis
within the left lower lobe. Peripheral scarring within the lingula.
Centrilobular and paraseptal emphysematous change. No pleural
effusion or pneumothorax.

Upper Abdomen: Cholelithiasis. Hree demonstrated postprocedural
appearance of the spleen. No acute process.

Musculoskeletal: Thoracic spine degenerative changes. No aggressive
or acute appearing osseous lesions. Old posterior left rib
fractures.
IMPRESSION: 1. No acute process within the chest.
2. Aortic Atherosclerosis (N5LLI-NEO.O) and Emphysema (N5LLI-[id number]).

## 2018-03-22 ENCOUNTER — Ambulatory Visit: Payer: Medicare Other

## 2018-04-05 ENCOUNTER — Ambulatory Visit: Payer: Medicare Other

## 2018-04-14 ENCOUNTER — Ambulatory Visit: Payer: Medicare Other

## 2018-04-15 ENCOUNTER — Ambulatory Visit (INDEPENDENT_AMBULATORY_CARE_PROVIDER_SITE_OTHER): Payer: Medicare Other

## 2018-04-15 VITALS — BP 142/80 | HR 74 | Temp 98.1°F | Ht 74.0 in | Wt 218.5 lb

## 2018-04-15 DIAGNOSIS — Z0000 Encounter for general adult medical examination without abnormal findings: Secondary | ICD-10-CM

## 2018-04-15 DIAGNOSIS — Z1211 Encounter for screening for malignant neoplasm of colon: Secondary | ICD-10-CM

## 2018-04-15 DIAGNOSIS — Z135 Encounter for screening for eye and ear disorders: Secondary | ICD-10-CM

## 2018-04-15 NOTE — Progress Notes (Addendum)
Subjective:   [name] is a 57 y.o. male who presents for an Initial Medicare Annual Wellness Visit.  Review of Systems  N/A Cardiac Risk Factors include: advanced age (>21men, >58 women);hypertension;dyslipidemia;male gender;sedentary lifestyle;smoking/ tobacco exposure    Objective:    Today's Vitals   04/15/18 0841  BP: (!) 142/80  Pulse: 74  Temp: 98.1 F (36.7 C)  TempSrc: Oral  SpO2: 94%  Weight: 218 lb 8 oz (99.1 kg)  Height: 6\' 2"  (1.88 m)   Body mass index is 28.05 kg/m.  Advanced Directives 04/15/2018 06/21/2017 05/24/2017 04/28/2017 04/23/2017 03/24/2017 02/22/2017  Does Patient Have a Medical Advance Directive? No No No No No No No  Would patient like information on creating a medical advance directive? Yes (MAU/Ambulatory/Procedural Areas - Information given) - - - - - -    Current Medications (verified) Outpatient Encounter Medications as of 04/15/2018  Medication Sig  . acetaminophen (TYLENOL) 500 MG tablet Take 1 tablet (500 mg total) by mouth every 6 (six) hours as needed.  Marland Kitchen aspirin EC 325 MG tablet Take 1 tablet (325 mg total) by mouth daily.  . citalopram (CELEXA) 20 MG tablet Take 1 tablet (20 mg total) daily by mouth.  Marland Kitchen ibuprofen (ADVIL,MOTRIN) 800 MG tablet Take 1 tablet (800 mg total) by mouth 3 (three) times daily.  . metoprolol succinate (TOPROL-XL) 100 MG 24 hr tablet Take 1 tablet (100 mg total) daily by mouth.  . rosuvastatin (CRESTOR) 40 MG tablet Take 1 tablet (40 mg total) by mouth daily.  Marland Kitchen oxyCODONE-acetaminophen (PERCOCET/ROXICET) 5-325 MG tablet Take 1 tablet by mouth every 6 (six) hours as needed. (Patient not taking: Reported on 04/15/2018)   No facility-administered encounter medications on file as of 04/15/2018.     Allergies (verified) No known allergies   History: Past Medical History:  Diagnosis Date  . CAD (coronary artery disease) 8/08   cath, bare metal  . DVT (deep venous thrombosis) ([place]) 9/09   left leg found after  ortho surgery  . HTN (hypertension)   . Hyperlipidemia   . Hypogonadism male   . Liver laceration 9/09  . Pneumothorax   . Pulmonary embolus ([place]) 9/09  . 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 Rib fractures 9/09   x 3  . Splenic laceration 9/09   Past Surgical History:  Procedure Laterality Date  . APPENDECTOMY    . BACK SURGERY    . CARDIAC CATHETERIZATION  8/08   90% focal R coronary, 80% R small obtuse marginal branch  . CERVICAL SPINE SURGERY  01/2009  . CORONARY STENT PLACEMENT  05/2007   right main  . FRACTURE SURGERY  9/09   orif, screw, syndesmotic screw  . splenic cautery  0/09  . THORACENTESIS  9/09   chest tube, ICU   Family History  Problem Relation Age of Onset  . Dementia Mother   . Congestive Heart Failure Mother   . Healthy Sister   . Healthy Brother   . Cancer Maternal Grandmother   . Heart attack Maternal Grandfather   . Healthy Sister    Social History   Socioeconomic History  . Marital status: Married    Spouse name: Marcie Bal  . Number of children: 2  . Years of education: Not on file  . Highest education level: 12th grade  Occupational History  . Occupation: Disabled  Social Needs  . Financial resource strain: Not hard at all  . Food insecurity:    Worry:  Never true    Inability: Never true  . Transportation needs:    Medical: No    Non-medical: No  Tobacco Use  . Smoking status: Current Every Day Smoker    Packs/day: 0.50    Years: 20.00    Pack years: 10.00    Types: Cigarettes    Start date: 10/18/1997  . Smokeless tobacco: Never Used  . Tobacco comment: using Nicoderm patches  Substance and Sexual Activity  . Alcohol use: Yes    Comment: occassionally  . Drug use: No  . Sexual activity: Yes    Partners: Female    Birth control/protection: None  Lifestyle  . Physical activity:    Days per week: 0 days    Minutes per session: 0 min  . Stress: Not at all  Relationships  . Social connections:     Talks on phone: Patient refused    Gets together: Patient refused    Attends religious service: Patient refused    Active member of club or organization: Patient refused    Attends meetings of clubs or organizations: Patient refused    Relationship status: Married  Other Topics Concern  . Not on file  Social History Narrative  . Not on file   Tobacco Counseling Ready to quit: Yes Counseling given: Yes Comment: using Nicoderm patches  Clinical Intake:  Pre-visit preparation completed: Yes  Pain : No/denies pain   BMI - recorded: 28.05 Nutritional Status: BMI 25 -29 Overweight Nutritional Risks: None Diabetes: No  How often do you need to have someone help you when you read instructions, pamphlets, or other written materials from your doctor or pharmacy?: 1 - Never  Interpreter Needed?: No  Information entered by :: AEversole, LPN  Activities of Daily Living In your present state of health, do you have any difficulty performing the following activities: 04/15/2018 09/17/2017  Hearing? N N  Comment denies hearing aids -  Vision? N N  Comment denies eyeglasses -  Difficulty concentrating or making decisions? N N  Walking or climbing stairs? Y N  Comment knees and hip pain -  Dressing or bathing? N N  Doing errands, shopping? N N  Preparing Food and eating ? N -  Comment denies dentures -  Using the Toilet? N -  In the past six months, have you accidently leaked urine? N -  Do you have problems with loss of bowel control? N -  Managing your Medications? N -  Managing your Finances? N -  Housekeeping or managing your Housekeeping? N -  Some recent data might be hidden     Immunizations and Health Maintenance  There is no immunization history on file for this patient. Health Maintenance Due  Topic Date Due  . INFLUENZA VACCINE  04/07/2018    Patient Care Team: Steele Sizer, MD as PCP - General (Family Medicine)  Indicate any recent Medical Services you may  have received from other than [facility] providers in the past year (date may be approximate).    Assessment:   This is a routine wellness examination for [name].  Hearing/Vision screen Vision Screening Comments: Not established with a provider for annual eye exams  Dietary issues and exercise activities discussed: Current Exercise Habits: The patient does not participate in regular exercise at present, Exercise limited by: None identified  Goals    . DIET - INCREASE WATER INTAKE     Recommend to drink at least 6-8 8oz glasses of water per day.  Depression Screen PHQ 2/9 Scores 04/15/2018 08/20/2017 07/21/2017 06/21/2017  PHQ - 2 Score 2 0 0 0  PHQ- 9 Score 8 - - -    Fall Risk Fall Risk  04/15/2018 09/17/2017 08/20/2017 07/21/2017 06/21/2017  Falls in the past year? No No No No No  Risk for fall due to : Impaired vision;Impaired balance/gait;Medication side effect - - - -  Risk for fall due to: Comment hip and knee pain - - - -    FALL RISK PREVENTION PERTAINING TO HOME: Is your home free of loose throw rugs in walkways, pet beds, electrical cords, etc? Yes Is there adequate lighting in your home to reduce risk of falls?  Yes Are there stairs in or around your home WITH handrails? Yes  ASSISTIVE DEVICES UTILIZED TO PREVENT FALLS: Use of a cane, walker or w/c? Yes, cane Grab bars in the bathroom? No  Shower chair or a place to sit while bathing? Yes An elevated toilet seat or a handicapped toilet? Yes  Timed Get Up and Go Performed: Yes. Pt ambulated 10 feet within 28 sec. Gait slow, steady and without the use of an assistive device. No intervention required at this time. Fall risk prevention has been discussed.  Community Resource Referral:  Pt declined my offer to send [employer] Referral to Care Guide for installation of grab bars in the shower.  Cognitive Function:     6CIT Screen 04/15/2018  What Year? 0 points  What month? 0 points  What time? 0 points  Count  back from 20 0 points  Months in reverse 0 points  Repeat phrase 2 points  Total Score 2    Screening Tests Health Maintenance  Topic Date Due  . INFLUENZA VACCINE  04/07/2018  . TETANUS/TDAP  07/21/2018 (Originally 11/27/1979)  . COLONOSCOPY  08/20/2018 (Originally 01/02/2018)  . HIV Screening  08/20/2018 (Originally 11/27/1975)  . Hepatitis C Screening  Completed    Qualifies for Shingles Vaccine? Yes. Due for Shingrix. Education has been provided regarding the importance of this vaccine. Pt has been advised to call insurance company to determine out of pocket expense. Advised may also receive vaccine at local pharmacy or Health Dept. Verbalized acceptance and understanding.  Overdue for Flu vaccine. Education has been provided regarding the importance of this vaccine and advised to receive when available. Verbalized acceptance and understanding.  Due for Tdap vaccine. Education has been provided regarding the importance of this vaccine. Advised may receive this vaccine at local pharmacy or Health Dept. Aware to provide a copy of the vaccination record if obtained from local pharmacy or Health Dept. Verbalized acceptance and understanding.  Cancer Screenings: Lung: Low Dose CT Chest recommended if Age 37-80 years, 30 pack-year currently smoking OR have quit w/in 15years. Patient does not qualify. Colorectal: Completed 01/02/13. Repeat every 5 years. Referral to GI placed today. Advised he will receive a call from our office re: his appt.  Additional Screenings: Hepatitis C Screening: Completed 09/07/94    Plan:  I have personally reviewed and addressed the Medicare Annual Wellness questionnaire and have noted the following in the patient's chart:  A. Medical and social history B. Use of alcohol, tobacco or illicit drugs  C. Current medications and supplements D. Functional ability and status E.  Nutritional status F.  Physical activity G. Advance directives H. List of other  physicians I.  Hospitalizations, surgeries, and ER visits in previous 12 months J.  [place] such as hearing and vision if needed,  cognitive and depression L. Referrals and appointments  In addition, I have reviewed and discussed with patient certain preventive protocols, quality metrics, and best practice recommendations. A written personalized care plan for preventive services as well as general preventive health recommendations were provided to patient.  See attached scanned questionnaire for additional information.   Signed,  Aleatha Borer, LPN Nurse Health Advisor  I have reviewed this encounter including the documentation in this note and/or discussed this patient with the provider, Aleatha Borer, LPN. I am certifying that I agree with the content of this note as supervising physician.  Steele Sizer, MD [facility] 04/15/2018, 12:48 PM

## 2018-04-15 NOTE — Patient Instructions (Signed)
[name] , Thank you for taking time to come for your Medicare Wellness Visit. I appreciate your ongoing commitment to your health goals. Please review the following plan we discussed and let me know if I can assist you in the future.   Screening recommendations/referrals: Colorectal Screening: Up to date  Vision and Dental Exams: Recommended annual ophthalmology exams for early detection of glaucoma and other disorders of the eye Recommended annual dental exams for proper oral hygiene  Vaccinations: Influenza vaccine: Overdue Pneumococcal vaccine: Not yet required Tdap vaccine: Declined. Please call your insurance company to determine your out of pocket expense. You may also receive this vaccine at your local pharmacy or Health Dept. Shingles vaccine: Please call your insurance company to determine your out of pocket expense for the Shingrix vaccine. You may also receive this vaccine at your local pharmacy or Health Dept.  Advanced directives: Advance directive discussed with you today. I have provided a copy for you to complete at home and have notarized. Once this is complete please bring a copy in to our office so we can scan it into your chart.  Goals: Recommend to drink at least 6-8 8oz glasses of water per day.  Next appointment: Please schedule your Annual Wellness Visit with your Nurse Health Advisor in one year.  Preventive Care 40-64 Years, Male Preventive care refers to lifestyle choices and visits with your health care provider that can promote health and wellness. What does preventive care include?  A yearly physical exam. This is also called an annual well check.  Dental exams once or twice a year.  Routine eye exams. Ask your health care provider how often you should have your eyes checked.  Personal lifestyle choices, including:  Daily care of your teeth and gums.  Regular physical activity.  Eating a healthy diet.  Avoiding tobacco and drug use.  Limiting  alcohol use.  Practicing safe sex.  Taking low-dose aspirin every day starting at age 76. What happens during an annual well check? The services and screenings done by your health care provider during your annual well check will depend on your age, overall health, lifestyle risk factors, and family history of disease. Counseling  Your health care provider may ask you questions about your:  Alcohol use.  Tobacco use.  Drug use.  Emotional well-being.  Home and relationship well-being.  Sexual activity.  Eating habits.  Work and work Statistician. Screening  You may have the following tests or measurements:  Height, weight, and BMI.  Blood pressure.  Lipid and cholesterol levels. These may be checked every 5 years, or more frequently if you are over 41 years old.  Skin check.  Lung cancer screening. You may have this screening every year starting at age 72 if you have a 30-pack-year history of smoking and currently smoke or have quit within the past 15 years.  Fecal occult blood test (FOBT) of the stool. You may have this test every year starting at age 8.  Flexible sigmoidoscopy or colonoscopy. You may have a sigmoidoscopy every 5 years or a colonoscopy every 10 years starting at age 56.  Prostate cancer screening. Recommendations will vary depending on your family history and other risks.  Hepatitis C blood test.  Hepatitis B blood test.  Sexually transmitted disease (STD) testing.  Diabetes screening. This is done by checking your blood sugar (glucose) after you have not eaten for a while (fasting). You may have this done every 1-3 years. Discuss your test results, treatment  options, and if necessary, the need for more tests with your health care provider. Vaccines  Your health care provider may recommend certain vaccines, such as:  Influenza vaccine. This is recommended every year.  Tetanus, diphtheria, and acellular pertussis (Tdap, Td) vaccine. You may  need a Td booster every 10 years.  Zoster vaccine. You may need this after age 45.  Pneumococcal 13-valent conjugate (PCV13) vaccine. You may need this if you have certain conditions and have not been vaccinated.  Pneumococcal polysaccharide (PPSV23) vaccine. You may need one or two doses if you smoke cigarettes or if you have certain conditions. Talk to your health care provider about which screenings and vaccines you need and how often you need them. This information is not intended to replace advice given to you by your health care provider. Make sure you discuss any questions you have with your health care provider. Document Released: 09/20/2015 Document Revised: 05/13/2016 Document Reviewed: 06/25/2015 Elsevier Interactive Patient Education  2017 [employer] Prevention in the Home Falls can cause injuries. They can happen to people of all ages. There are many things you can do to make your home safe and to help prevent falls. What can I do on the outside of my home?  Regularly fix the edges of walkways and driveways and fix any cracks.  Remove anything that might make you trip as you walk through a door, such as a raised step or threshold.  Trim any bushes or trees on the path to your home.  Use bright outdoor lighting.  Clear any walking paths of anything that might make someone trip, such as rocks or tools.  Regularly check to see if handrails are loose or broken. Make sure that both sides of any steps have handrails.  Any raised decks and porches should have guardrails on the edges.  Have any leaves, snow, or ice cleared regularly.  Use sand or salt on walking paths during winter.  Clean up any spills in your garage right away. This includes oil or grease spills. What can I do in the bathroom?  Use night lights.  Install grab bars by the toilet and in the tub and shower. Do not use towel bars as grab bars.  Use non-skid mats or decals in the tub or  shower.  If you need to sit down in the shower, use a plastic, non-slip stool.  Keep the floor dry. Clean up any water that spills on the floor as soon as it happens.  Remove soap buildup in the tub or shower regularly.  Attach bath mats securely with double-sided non-slip rug tape.  Do not have throw rugs and other things on the floor that can make you trip. What can I do in the bedroom?  Use night lights.  Make sure that you have a light by your bed that is easy to reach.  Do not use any sheets or blankets that are too big for your bed. They should not hang down onto the floor.  Have a firm chair that has side arms. You can use this for support while you get dressed.  Do not have throw rugs and other things on the floor that can make you trip. What can I do in the kitchen?  Clean up any spills right away.  Avoid walking on wet floors.  Keep items that you use a lot in easy-to-reach places.  If you need to reach something above you, use a strong step stool that has a  grab bar.  Keep electrical cords out of the way.  Do not use floor polish or wax that makes floors slippery. If you must use wax, use non-skid floor wax.  Do not have throw rugs and other things on the floor that can make you trip. What can I do with my stairs?  Do not leave any items on the stairs.  Make sure that there are handrails on both sides of the stairs and use them. Fix handrails that are broken or loose. Make sure that handrails are as long as the stairways.  Check any carpeting to make sure that it is firmly attached to the stairs. Fix any carpet that is loose or worn.  Avoid having throw rugs at the top or bottom of the stairs. If you do have throw rugs, attach them to the floor with carpet tape.  Make sure that you have a light switch at the top of the stairs and the bottom of the stairs. If you do not have them, ask someone to add them for you. What else can I do to help prevent  falls?  Wear shoes that:  Do not have high heels.  Have rubber bottoms.  Are comfortable and fit you well.  Are closed at the toe. Do not wear sandals.  If you use a stepladder:  Make sure that it is fully opened. Do not climb a closed stepladder.  Make sure that both sides of the stepladder are locked into place.  Ask someone to hold it for you, if possible.  Clearly mark and make sure that you can see:  Any grab bars or handrails.  First and last steps.  Where the edge of each step is.  Use tools that help you move around (mobility aids) if they are needed. These include:  Canes.  Walkers.  Scooters.  Crutches.  Turn on the lights when you go into a dark area. Replace any light bulbs as soon as they burn out.  Set up your furniture so you have a clear path. Avoid moving your furniture around.  If any of your floors are uneven, fix them.  If there are any pets around you, be aware of where they are.  Review your medicines with your doctor. Some medicines can make you feel dizzy. This can increase your chance of falling. Ask your doctor what other things that you can do to help prevent falls. This information is not intended to replace advice given to you by your health care provider. Make sure you discuss any questions you have with your health care provider. Document Released: 06/20/2009 Document Revised: 01/30/2016 Document Reviewed: 09/28/2014 Elsevier Interactive Patient Education  2017 [employer].

## 2018-04-18 ENCOUNTER — Ambulatory Visit: Payer: Medicare Other | Admitting: Family Medicine

## 2018-04-20 ENCOUNTER — Telehealth: Payer: Self-pay | Admitting: Gastroenterology

## 2018-04-20 ENCOUNTER — Other Ambulatory Visit: Payer: Self-pay

## 2018-04-20 DIAGNOSIS — Z1211 Encounter for screening for malignant neoplasm of colon: Secondary | ICD-10-CM

## 2018-04-20 NOTE — Telephone Encounter (Signed)
Gastroenterology Pre-Procedure Review  Request Date: 05/25/18    [facility] Requesting Physician: Dr. Bonna Gains  PATIENT REVIEW QUESTIONS: The patient responded to the following health history questions as indicated:    1. Are you having any GI issues? no 2. Do you have a personal history of Polyps? yes (2005 ?) 3. Do you have a family history of Colon Cancer or Polyps? no 4. Diabetes Mellitus? no 5. Joint replacements in the past 12 months?no 6. Major health problems in the past 3 months?no 7. Any artificial heart valves, MVP, or defibrillator?no    MEDICATIONS & ALLERGIES:    Patient reports the following regarding taking any anticoagulation/antiplatelet therapy:   Plavix, Coumadin, Eliquis, Xarelto, Lovenox, Pradaxa, Brilinta, or Effient? no Aspirin? yes (325 mg)  Patient confirms/reports the following medications:  Current Outpatient Medications  Medication Sig Dispense Refill  . acetaminophen (TYLENOL) 500 MG tablet Take 1 tablet (500 mg total) by mouth every 6 (six) hours as needed. 120 tablet 0  . aspirin EC 325 MG tablet Take 1 tablet (325 mg total) by mouth daily. 30 tablet 0  . citalopram (CELEXA) 20 MG tablet Take 1 tablet (20 mg total) daily by mouth. 90 tablet 0  . ibuprofen (ADVIL,MOTRIN) 800 MG tablet Take 1 tablet (800 mg total) by mouth 3 (three) times daily. 30 tablet 0  . metoprolol succinate (TOPROL-XL) 100 MG 24 hr tablet Take 1 tablet (100 mg total) daily by mouth. 90 tablet 0  . oxyCODONE-acetaminophen (PERCOCET/ROXICET) 5-325 MG tablet Take 1 tablet by mouth every 6 (six) hours as needed. (Patient not taking: Reported on 04/15/2018) 20 tablet 0  . rosuvastatin (CRESTOR) 40 MG tablet Take 1 tablet (40 mg total) by mouth daily. 90 tablet 3   No current facility-administered medications for this visit.     Patient confirms/reports the following allergies:  Allergies  Allergen Reactions  . No Known Allergies     No orders of the defined types were placed in this  encounter.   AUTHORIZATION INFORMATION Primary Insurance: 1D#: Group #:  Secondary Insurance: 1D#: Group #:  SCHEDULE INFORMATION: Date: 05/25/18    [place] Time: Location: [place]

## 2018-05-25 ENCOUNTER — Ambulatory Visit: Payer: Medicare Other | Admitting: Anesthesiology

## 2018-05-25 ENCOUNTER — Ambulatory Visit
Admission: RE | Admit: 2018-05-25 | Discharge: 2018-05-25 | Disposition: A | Discharge status: Home / Self Care | Payer: Medicare Other | Source: Ambulatory Visit | Attending: Gastroenterology | Admitting: Gastroenterology

## 2018-05-25 ENCOUNTER — Encounter
Admission: RE | Disposition: A | Discharge status: Home / Self Care | Payer: Self-pay | Source: Ambulatory Visit | Attending: Gastroenterology

## 2018-05-25 DIAGNOSIS — Z1211 Encounter for screening for malignant neoplasm of colon: Secondary | ICD-10-CM

## 2018-05-25 DIAGNOSIS — Z955 Presence of coronary angioplasty implant and graft: Secondary | ICD-10-CM | POA: Insufficient documentation

## 2018-05-25 DIAGNOSIS — Z7982 Long term (current) use of aspirin: Secondary | ICD-10-CM | POA: Insufficient documentation

## 2018-05-25 DIAGNOSIS — D125 Benign neoplasm of sigmoid colon: Secondary | ICD-10-CM

## 2018-05-25 DIAGNOSIS — K635 Polyp of colon: Secondary | ICD-10-CM

## 2018-05-25 DIAGNOSIS — D122 Benign neoplasm of ascending colon: Secondary | ICD-10-CM | POA: Insufficient documentation

## 2018-05-25 DIAGNOSIS — E785 Hyperlipidemia, unspecified: Secondary | ICD-10-CM | POA: Insufficient documentation

## 2018-05-25 DIAGNOSIS — D124 Benign neoplasm of descending colon: Secondary | ICD-10-CM | POA: Diagnosis not present

## 2018-05-25 DIAGNOSIS — I2510 Atherosclerotic heart disease of native coronary artery without angina pectoris: Secondary | ICD-10-CM | POA: Insufficient documentation

## 2018-05-25 DIAGNOSIS — Z86711 Personal history of pulmonary embolism: Secondary | ICD-10-CM | POA: Insufficient documentation

## 2018-05-25 DIAGNOSIS — I10 Essential (primary) hypertension: Secondary | ICD-10-CM | POA: Insufficient documentation

## 2018-05-25 DIAGNOSIS — K5730 Diverticulosis of large intestine without perforation or abscess without bleeding: Secondary | ICD-10-CM | POA: Insufficient documentation

## 2018-05-25 DIAGNOSIS — F17210 Nicotine dependence, cigarettes, uncomplicated: Secondary | ICD-10-CM | POA: Insufficient documentation

## 2018-05-25 DIAGNOSIS — Z86718 Personal history of other venous thrombosis and embolism: Secondary | ICD-10-CM | POA: Insufficient documentation

## 2018-05-25 DIAGNOSIS — Z79899 Other long term (current) drug therapy: Secondary | ICD-10-CM | POA: Diagnosis not present

## 2018-05-25 HISTORY — PX: COLONOSCOPY WITH PROPOFOL: SHX5780

## 2018-05-25 SURGERY — COLONOSCOPY WITH PROPOFOL
Anesthesia: General

## 2018-05-25 MED ORDER — SODIUM CHLORIDE 0.9 % IV SOLN
INTRAVENOUS | Status: DC
  Administered 2018-05-25: 08:00:00 via INTRAVENOUS

## 2018-05-25 MED ORDER — PROPOFOL 10 MG/ML IV BOLUS
INTRAVENOUS | Status: AC
  Filled 2018-05-25: qty 20

## 2018-05-25 MED ORDER — PROPOFOL 500 MG/50ML IV EMUL
INTRAVENOUS | Status: AC
  Filled 2018-05-25: qty 50

## 2018-05-25 MED ORDER — PROPOFOL 500 MG/50ML IV EMUL
INTRAVENOUS | Status: DC | PRN
  Administered 2018-05-25: 150 ug/kg/min via INTRAVENOUS

## 2018-05-25 MED ORDER — LIDOCAINE HCL (CARDIAC) PF 100 MG/5ML IV SOSY
PREFILLED_SYRINGE | INTRAVENOUS | Status: DC | PRN
  Administered 2018-05-25: 100 mg via INTRAVENOUS

## 2018-05-25 MED ORDER — LIDOCAINE HCL (PF) 2 % IJ SOLN
INTRAMUSCULAR | Status: AC
  Filled 2018-05-25: qty 10

## 2018-05-25 MED ORDER — PROPOFOL 10 MG/ML IV BOLUS
INTRAVENOUS | Status: DC | PRN
  Administered 2018-05-25: 100 mg via INTRAVENOUS
  Administered 2018-05-25: 30 mg via INTRAVENOUS

## 2018-05-25 NOTE — Op Note (Signed)
[facility] Patient Name: [name] Procedure Date: 05/25/2018 7:21 AM MRN: [id number] Account #: [id number] Date of Birth: 09-Aug-1961 Admit Type: Outpatient Age: 57 Room: [facility] ENDO ROOM 2 Gender: Male Note Status: Finalized Procedure:            Colonoscopy Indications:          Screening for colorectal malignant neoplasm Providers:            Saphyre Cillo B. Bonna Gains MD, MD Referring MD:         Bethena Roys. Sowles, MD (Referring MD) Medicines:            Monitored Anesthesia Care Complications:        No immediate complications. Procedure:            Pre-Anesthesia Assessment:                       - ASA Grade Assessment: II - A patient with mild                        systemic disease.                       - Prior to the procedure, a History and Physical was                        performed, and patient medications, allergies and                        sensitivities were reviewed. The patient's tolerance of                        previous anesthesia was reviewed.                       - The risks and benefits of the procedure and the                        sedation options and risks were discussed with the                        patient. All questions were answered and informed                        consent was obtained.                       - Patient identification and proposed procedure were                        verified prior to the procedure by the physician, the                        nurse, the anesthesiologist, the anesthetist and the                        technician. The procedure was verified in the procedure                        room.  After obtaining informed consent, the colonoscope was                        passed under direct vision. Throughout the procedure,                        the patient's blood pressure, pulse, and oxygen                        saturations were monitored continuously. The                    Colonoscope was introduced through the anus and                        advanced to the the cecum, identified by appendiceal                        orifice and ileocecal valve. The colonoscopy was                        performed with ease. The patient tolerated the                        procedure well. The quality of the bowel preparation                        was fair. Findings:      The perianal and digital rectal examinations were normal.      Three sessile polyps were found in the descending colon and ascending       colon. The polyps were 2 to 4 mm in size. These polyps were removed with       a cold biopsy forceps. Resection and retrieval were complete.      Four sessile polyps were found in the sigmoid colon. The polyps were 4       to 5 mm in size. These polyps were removed with a cold snare. Resection       and retrieval were complete. Other hyperplastic appearing polyps were       seen in the area. The 4 removed were the largest of the hyperlastic       appearing polyps.      Multiple diverticula were found in the sigmoid colon.      The exam was otherwise without abnormality.      The rectum, sigmoid colon, descending colon, transverse colon, ascending       colon and cecum appeared normal.      The retroflexed view of the distal rectum and anal verge was normal and       showed no anal or rectal abnormalities. Impression:           - Preparation of the colon was fair.                       - Three 2 to 4 mm polyps in the descending colon and in                        the ascending colon, removed with a cold biopsy  forceps. Resected and retrieved.                       - Four 4 to 5 mm polyps in the sigmoid colon, removed                        with a cold snare. Resected and retrieved.                       - Diverticulosis in the sigmoid colon.                       - The examination was otherwise normal.                       - The  rectum, sigmoid colon, descending colon,                        transverse colon, ascending colon and cecum are normal.                       - The distal rectum and anal verge are normal on                        retroflexion view. Recommendation:       - Discharge patient to home (with escort).                       - High fiber diet.                       - Advance diet as tolerated.                       - Continue present medications.                       - Await pathology results.                       - Repeat colonoscopy in 2 years with 2 day prep due to                        fair prep on today's exam.                       - The findings and recommendations were discussed with                        the patient.                       - The findings and recommendations were discussed with                        the patient's family.                       - Return to primary care physician as previously                        scheduled. Procedure Code(s):    --- Professional ---  45385, Colonoscopy, flexible; with removal of tumor(s),                        polyp(s), or other lesion(s) by snare technique                       45380, 59, Colonoscopy, flexible; with biopsy, single                        or multiple Diagnosis Code(s):    --- Professional ---                       Z12.11, Encounter for screening for malignant neoplasm                        of colon                       D12.4, Benign neoplasm of descending colon                       D12.2, Benign neoplasm of ascending colon                       D12.5, Benign neoplasm of sigmoid colon                       K57.30, Diverticulosis of large intestine without                        perforation or abscess without bleeding CPT copyright 2017 American Medical Association. All rights reserved. The codes documented in this report are preliminary and upon coder review may  be revised to meet current  compliance requirements.  Vonda Antigua, MD Margretta Sidle B. Bonna Gains MD, MD 05/25/2018 9:00:39 AM This report has been signed electronically. Number of Addenda: 0 Note Initiated On: 05/25/2018 7:21 AM Scope Withdrawal Time: 0 hours 24 minutes 59 seconds  Total Procedure Duration: 0 hours 32 minutes 38 seconds  Estimated Blood Loss: Estimated blood loss: none.      [facility]

## 2018-05-25 NOTE — Transfer of Care (Signed)
Immediate Anesthesia Transfer of Care Note  Patient: [name]  Procedure(s) Performed: COLONOSCOPY WITH PROPOFOL (N/A )  Patient Location: Endoscopy Unit  Anesthesia Type:General  Level of Consciousness: sedated  Airway & Oxygen Therapy: Patient Spontanous Breathing and Patient connected to nasal cannula oxygen  Post-op Assessment: Report given to RN and Post -op Vital signs reviewed and stable  Post vital signs: Reviewed and stable  Last Vitals:  Vitals Value Taken Time  BP 85/56 05/25/2018  8:57 AM  Temp 36.1 C 05/25/2018  8:57 AM  Pulse 90 05/25/2018  8:58 AM  Resp 7 05/25/2018  8:58 AM  SpO2 98 % 05/25/2018  8:58 AM  Vitals shown include unvalidated device data.  Last Pain:  Vitals:   05/25/18 0857  TempSrc: Tympanic         Complications: No apparent anesthesia complications

## 2018-05-25 NOTE — H&P (Signed)
Vonda Antigua, MD [address], [place], [place], [place], [address] [address], [place], [place], [place], [address] Phone: [phone]  Fax: [phone]  Primary Care Physician:  Steele Sizer, MD   Pre-Procedure History & Physical: HPI:  [name] is a 57 y.o. male is here for a colonoscopy.   Past Medical History:  Diagnosis Date  . CAD (coronary artery disease) 8/08   cath, bare metal  . DVT (deep venous thrombosis) ([place]) 9/09   left leg found after ortho surgery  . HTN (hypertension)   . Hyperlipidemia   . Hypogonadism male   . Liver laceration 9/09  . Pneumothorax   . Pulmonary embolus ([place]) 9/09  . 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 Rib fractures 9/09   x 3  . Splenic laceration 9/09    Past Surgical History:  Procedure Laterality Date  . APPENDECTOMY    . BACK SURGERY    . CARDIAC CATHETERIZATION  8/08   90% focal R coronary, 80% R small obtuse marginal branch  . CERVICAL SPINE SURGERY  01/2009  . CORONARY STENT PLACEMENT  05/2007   right main  . FRACTURE SURGERY  9/09   orif, screw, syndesmotic screw  . splenic cautery  0/09  . THORACENTESIS  9/09   chest tube, ICU    Prior to Admission medications   Medication Sig Start Date End Date Taking? Authorizing Provider  acetaminophen (TYLENOL) 500 MG tablet Take 1 tablet (500 mg total) by mouth every 6 (six) hours as needed. 09/17/17  Yes Steele Sizer, MD  aspirin EC 325 MG tablet Take 1 tablet (325 mg total) by mouth daily. 09/17/17  Yes Sowles, Drue Stager, MD  citalopram (CELEXA) 20 MG tablet Take 1 tablet (20 mg total) daily by mouth. 07/21/17  Yes Roselee Nova, MD  ibuprofen (ADVIL,MOTRIN) 800 MG tablet Take 1 tablet (800 mg total) by mouth 3 (three) times daily. 01/04/18  Yes Raylene Everts, MD  rosuvastatin (CRESTOR) 40 MG tablet Take 1 tablet (40 mg total) by mouth daily. 09/20/17  Yes Sowles, Drue Stager, MD  metoprolol succinate (TOPROL-XL) 100 MG 24 hr  tablet Take 1 tablet (100 mg total) daily by mouth. 07/21/17   Roselee Nova, MD  oxyCODONE-acetaminophen (PERCOCET/ROXICET) 5-325 MG tablet Take 1 tablet by mouth every 6 (six) hours as needed. Patient not taking: Reported on 04/15/2018 01/25/18   Milton Ferguson, MD    Allergies as of 04/20/2018 - Review Complete 04/15/2018  Allergen Reaction Noted  . No known allergies  02/21/2015    Family History  Problem Relation Age of Onset  . Dementia Mother   . Congestive Heart Failure Mother   . Healthy Sister   . Healthy Brother   . Cancer Maternal Grandmother   . Heart attack Maternal Grandfather   . Healthy Sister     Social History   Socioeconomic History  . Marital status: Married    Spouse name: Marcie Bal  . Number of children: 2  . Years of education: Not on file  . Highest education level: 12th grade  Occupational History  . Occupation: Disabled  Social Needs  . Financial resource strain: Not hard at all  . Food insecurity:    Worry: Never true    Inability: Never true  . Transportation needs:    Medical: No    Non-medical: No  Tobacco Use  . Smoking status: Current Every Day Smoker    Packs/day: 0.50  Years: 20.00    Pack years: 10.00    Types: Cigarettes    Start date: 10/18/1997  . Smokeless tobacco: Never Used  . Tobacco comment: using Nicoderm patches  Substance and Sexual Activity  . Alcohol use: Yes    Comment: occassionally  . Drug use: No  . Sexual activity: Yes    Partners: Female    Birth control/protection: None  Lifestyle  . Physical activity:    Days per week: 0 days    Minutes per session: 0 min  . Stress: Not at all  Relationships  . Social connections:    Talks on phone: Patient refused    Gets together: Patient refused    Attends religious service: Patient refused    Active member of club or organization: Patient refused    Attends meetings of clubs or organizations: Patient refused    Relationship status: Married  . Intimate  partner violence:    Fear of current or ex partner: No    Emotionally abused: No    Physically abused: No    Forced sexual activity: No  Other Topics Concern  . Not on file  Social History Narrative  . Not on file    Review of Systems: See HPI, otherwise negative ROS  Physical Exam: BP (!) 145/109   Pulse (!) 101   Temp (!) 96.2 F (35.7 C) (Tympanic)   Resp 16   Ht 6\' 2"  (1.88 m)   Wt 98.4 kg   SpO2 100%   BMI 27.86 kg/m  General:   Alert,  pleasant and cooperative in NAD Head:  Normocephalic and atraumatic. Neck:  Supple; no masses or thyromegaly. Lungs:  Clear throughout to auscultation, normal respiratory effort.    Heart:  +S1, +S2, Regular rate and rhythm, No edema. Abdomen:  Soft, nontender and nondistended. Normal bowel sounds, without guarding, and without rebound.   Neurologic:  Alert and  oriented x4;  grossly normal neurologically.  Impression/Plan: [name] is here for a colonoscopy to be performed for average risk screening.  Risks, benefits, limitations, and alternatives regarding  colonoscopy have been reviewed with the patient.  Questions have been answered.  All parties agreeable.   Virgel Manifold, MD  05/25/2018, 8:10 AM

## 2018-05-25 NOTE — Anesthesia Postprocedure Evaluation (Signed)
Anesthesia Post Note  Patient: [name]  Procedure(s) Performed: COLONOSCOPY WITH PROPOFOL (N/A )  Patient location during evaluation: Endoscopy Anesthesia Type: General Level of consciousness: awake and alert Pain management: pain level controlled Vital Signs Assessment: post-procedure vital signs reviewed and stable Respiratory status: spontaneous breathing, nonlabored ventilation, respiratory function stable and patient connected to nasal cannula oxygen Cardiovascular status: blood pressure returned to baseline and stable Postop Assessment: no apparent nausea or vomiting Anesthetic complications: no     Last Vitals:  Vitals:   05/25/18 0920 05/25/18 0930  BP: 132/88 (!) 127/101  Pulse: 74 71  Resp: 16 19  Temp:    SpO2: 100% 100%    Last Pain:  Vitals:   05/25/18 0857  TempSrc: Tympanic                 Martha Clan

## 2018-05-25 NOTE — Anesthesia Post-op Follow-up Note (Signed)
Anesthesia QCDR form completed.        

## 2018-05-25 NOTE — Anesthesia Preprocedure Evaluation (Signed)
Anesthesia Evaluation  Patient identified by MRN, date of birth, ID band Patient awake    Reviewed: Allergy & Precautions, H&P , NPO status , Patient's Chart, lab work & pertinent test results, reviewed documented beta blocker date and time   History of Anesthesia Complications Negative for: history of anesthetic complications  Airway Mallampati: III  TM Distance: >3 FB Neck ROM: full    Dental  (+) Dental Advidsory Given, Poor Dentition, Missing, Chipped   Pulmonary neg shortness of breath, neg sleep apnea, COPD, neg recent URI, Current Smoker,           Cardiovascular Exercise Tolerance: Good hypertension, (-) angina+ CAD and + Cardiac Stents  (-) Past MI and (-) CABG (-) dysrhythmias (-) Valvular Problems/Murmurs     Neuro/Psych PSYCHIATRIC DISORDERS Anxiety Depression negative neurological ROS     GI/Hepatic negative GI ROS, Neg liver ROS,   Endo/Other  negative endocrine ROS  Renal/GU negative Renal ROS  negative genitourinary   Musculoskeletal   Abdominal   Peds  Hematology negative hematology ROS (+)   Anesthesia Other Findings Past Medical History: 8/08: CAD (coronary artery disease)     Comment:  cath, bare metal 9/09: DVT (deep venous thrombosis) ([facility])     Comment:  left leg found after ortho surgery No date: HTN (hypertension) No date: Hyperlipidemia No date: Hypogonadism male 9/09: Liver laceration No date: Pneumothorax 9/09: Pulmonary embolus ([place]) 01/2009: Retroperitoneal hematoma     Comment:  left, iliopsoas hematoma 9/09: Rib fractures     Comment:  x 3 9/09: Splenic laceration   Reproductive/Obstetrics negative OB ROS                             Anesthesia Physical Anesthesia Plan  ASA: II  Anesthesia Plan: General   Post-op Pain Management:    Induction: Intravenous  PONV Risk Score and Plan: 1 and Propofol infusion and TIVA  Airway Management  Planned: Nasal Cannula and Natural Airway  Additional Equipment:   Intra-op Plan:   Post-operative Plan:   Informed Consent: I have reviewed the patients History and Physical, chart, labs and discussed the procedure including the risks, benefits and alternatives for the proposed anesthesia with the patient or authorized representative who has indicated his/her understanding and acceptance.   Dental Advisory Given  Plan Discussed with: Anesthesiologist, CRNA and Surgeon  Anesthesia Plan Comments:         Anesthesia Quick Evaluation

## 2018-05-26 ENCOUNTER — Encounter: Payer: Self-pay | Admitting: Gastroenterology

## 2018-05-26 LAB — SURGICAL PATHOLOGY

## 2018-09-28 ENCOUNTER — Emergency Department
Admission: EM | Admit: 2018-09-28 | Discharge: 2018-09-28 | Disposition: A | Discharge status: Home / Self Care | Payer: Medicare Other | Attending: Emergency Medicine | Admitting: Emergency Medicine

## 2018-09-28 ENCOUNTER — Encounter: Payer: Self-pay | Admitting: Emergency Medicine

## 2018-09-28 ENCOUNTER — Other Ambulatory Visit: Payer: Self-pay

## 2018-09-28 DIAGNOSIS — K047 Periapical abscess without sinus: Secondary | ICD-10-CM | POA: Insufficient documentation

## 2018-09-28 DIAGNOSIS — Z7982 Long term (current) use of aspirin: Secondary | ICD-10-CM | POA: Diagnosis not present

## 2018-09-28 DIAGNOSIS — F17210 Nicotine dependence, cigarettes, uncomplicated: Secondary | ICD-10-CM | POA: Diagnosis not present

## 2018-09-28 DIAGNOSIS — I10 Essential (primary) hypertension: Secondary | ICD-10-CM | POA: Insufficient documentation

## 2018-09-28 DIAGNOSIS — I2510 Atherosclerotic heart disease of native coronary artery without angina pectoris: Secondary | ICD-10-CM | POA: Diagnosis not present

## 2018-09-28 DIAGNOSIS — Z79899 Other long term (current) drug therapy: Secondary | ICD-10-CM | POA: Diagnosis not present

## 2018-09-28 DIAGNOSIS — K0889 Other specified disorders of teeth and supporting structures: Secondary | ICD-10-CM | POA: Diagnosis present

## 2018-09-28 MED ORDER — PENICILLIN V POTASSIUM 500 MG PO TABS: 500.0000 mg | ORAL_TABLET | Freq: Four times a day (QID) | ORAL | 0 refills | Status: AC

## 2018-09-28 MED ORDER — KETOROLAC TROMETHAMINE 30 MG/ML IJ SOLN
30.0000 mg | Freq: Once | INTRAMUSCULAR | Status: AC
  Administered 2018-09-28: 30 mg via INTRAMUSCULAR

## 2018-09-28 MED ORDER — HYDROCODONE-ACETAMINOPHEN 5-325 MG PO TABS: 2.0000 | ORAL_TABLET | Freq: Four times a day (QID) | ORAL | 0 refills | Status: DC | PRN

## 2018-09-28 MED ORDER — LIDOCAINE VISCOUS HCL 2 % MT SOLN
15.0000 mL | Freq: Once | OROMUCOSAL | Status: AC
  Administered 2018-09-28: 15 mL via OROMUCOSAL

## 2018-09-28 MED ORDER — KETOROLAC TROMETHAMINE 30 MG/ML IJ SOLN
INTRAMUSCULAR | Status: AC
  Administered 2018-09-28: 30 mg via INTRAMUSCULAR
  Filled 2018-09-28: qty 1

## 2018-09-28 NOTE — ED Triage Notes (Addendum)
Pt presents to ED with lower right sided dental pain for the past several days.

## 2018-09-28 NOTE — ED Provider Notes (Signed)
[facility] Emergency Department Provider Note  ____________________________________________   First MD Initiated Contact with Patient 09/28/18 [phone]     (approximate)  I have reviewed the triage vital signs and the nursing notes.   HISTORY  Chief Complaint Dental Pain    HPI [name] is a 58 y.o. male with medical history as listed below which notably includes fairly severe dental caries.  He presents for evaluation of gradually worsening right lower dental pain for about 3 days.  He states that eating and drinking anything especially at extremes of temperature make it worse.  Initially Orajel made it better but that has stopped working as well.  He has been trying to take oral pain medicine which seems to be minimally effective.  He knows he needs to see dentist but has not yet been able to find one.  He denies fever/chills, chest pain, shortness of breath, nausea, vomiting, difficulty swallowing, sore throat, and any swelling in his mouth or his throat.  Past Medical History:  Diagnosis Date  . CAD (coronary artery disease) 8/08   cath, bare metal  . DVT (deep venous thrombosis) ([place]) 9/09   left leg found after ortho surgery  . HTN (hypertension)   . Hyperlipidemia   . Hypogonadism male   . Liver laceration 9/09  . Pneumothorax   . Pulmonary embolus ([place]) 9/09  . 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 Rib fractures 9/09   x 3  . Splenic laceration 9/09    Patient Active Problem List   Diagnosis Date Noted  . Encounter for screening colonoscopy   . Polyp of sigmoid colon   . Diverticulosis of large intestine without diverticulitis   . Benign neoplasm of descending colon   . Benign neoplasm of ascending colon   . CAD (coronary artery disease), native coronary artery 10/29/2017  . Thoracic aorta atherosclerosis ([place]) 10/03/2017  . Cholelithiasis 10/03/2017  . Coronary artery calcification 10/03/2017  . Emphysema  lung ([place]) 10/03/2017  . Depression 10/16/2015  . Chronic cervical radiculopathy 02/21/2015  . 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 ASCVD (arteriosclerotic cardiovascular disease) 01/18/2014  . Traumatic brain injury ([place]) 01/18/2014  . Chronic pain 01/18/2014  . Substance abuse ([place]) 12/12/2013  . Substance induced mood disorder ([place]) 12/12/2013  . 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 Osteoarthritis of left hip 11/15/2009  . GAD (generalized anxiety disorder) 05/22/2009  . MEMORY LOSS 05/22/2009  . Hyperlipidemia 12/03/2008  . TOBACCO USE 08/29/2008  . Essential hypertension 07/04/2008  . History of pulmonary embolus (PE) 07/04/2008  . INSOMNIA UNSPECIFIED 07/04/2008    Past Surgical History:  Procedure Laterality Date  . APPENDECTOMY    . BACK SURGERY    . CARDIAC CATHETERIZATION  8/08   90% focal R coronary, 80% R small obtuse marginal branch  . CERVICAL SPINE SURGERY  01/2009  . COLONOSCOPY WITH PROPOFOL N/A 05/25/2018   Procedure: COLONOSCOPY WITH PROPOFOL;  Surgeon: Virgel Manifold, MD;  Location: [facility] ENDOSCOPY;  Service: Endoscopy;  Laterality: N/A;  . CORONARY STENT PLACEMENT  05/2007   right main  . FRACTURE SURGERY  9/09   orif, screw, syndesmotic screw  . splenic cautery  0/09  . THORACENTESIS  9/09   chest tube, ICU    Prior to Admission medications   Medication Sig Start Date End Date Taking? Authorizing Provider  acetaminophen (TYLENOL) 500 MG tablet Take 1 tablet (500 mg total) by mouth  every 6 (six) hours as needed. 09/17/17   Steele Sizer, MD  aspirin EC 325 MG tablet Take 1 tablet (325 mg total) by mouth daily. 09/17/17   Steele Sizer, MD  citalopram (CELEXA) 20 MG tablet Take 1 tablet (20 mg total) daily by mouth. 07/21/17   Roselee Nova, MD  HYDROcodone-acetaminophen (NORCO/VICODIN) 5-325 MG tablet Take 2 tablets by mouth every 6 (six) hours as needed for moderate pain or severe pain.  09/28/18   Hinda Kehr, MD  ibuprofen (ADVIL,MOTRIN) 800 MG tablet Take 1 tablet (800 mg total) by mouth 3 (three) times daily. 01/04/18   Raylene Everts, MD  metoprolol succinate (TOPROL-XL) 100 MG 24 hr tablet Take 1 tablet (100 mg total) daily by mouth. 07/21/17   Roselee Nova, MD  oxyCODONE-acetaminophen (PERCOCET/ROXICET) 5-325 MG tablet Take 1 tablet by mouth every 6 (six) hours as needed. Patient not taking: Reported on 04/15/2018 01/25/18   Milton Ferguson, MD  penicillin v potassium (VEETID) 500 MG tablet Take 1 tablet (500 mg total) by mouth 4 (four) times daily for 10 days. 09/28/18 10/08/18  Hinda Kehr, MD  rosuvastatin (CRESTOR) 40 MG tablet Take 1 tablet (40 mg total) by mouth daily. 09/20/17   Steele Sizer, MD    Allergies No known allergies  Family History  Problem Relation Age of Onset  . Dementia Mother   . Congestive Heart Failure Mother   . Healthy Sister   . Healthy Brother   . Cancer Maternal Grandmother   . Heart attack Maternal Grandfather   . Healthy Sister     Social History Social History   Tobacco Use  . Smoking status: Current Every Day Smoker    Packs/day: 0.50    Years: 20.00    Pack years: 10.00    Types: Cigarettes    Start date: 10/18/1997  . Smokeless tobacco: Never Used  . Tobacco comment: using Nicoderm patches  Substance Use Topics  . Alcohol use: Yes    Comment: occassionally  . Drug use: No    Review of Systems Constitutional: No fever/chills Eyes: No visual changes. ENT: Dental pain as described above in the lower right part of his jaw. Cardiovascular: Denies chest pain. Respiratory: Denies shortness of breath. Gastrointestinal: No abdominal pain.  No nausea, no vomiting.   Integumentary: Negative for rash. Neurological: Negative for headaches, focal weakness or numbness.   ____________________________________________   PHYSICAL EXAM:  VITAL SIGNS: ED Triage Vitals  Enc Vitals Group     BP 09/28/18 0506 (!)  183/100     Pulse Rate 09/28/18 0506 91     Resp 09/28/18 0506 20     Temp 09/28/18 0506 98.1 F (36.7 C)     Temp Source 09/28/18 0506 Oral     SpO2 09/28/18 0506 99 %     Weight 09/28/18 0507 97.5 kg (215 lb)     Height 09/28/18 0507 1.88 m (6\' 2" )     Head Circumference --      Peak Flow --      Pain Score 09/28/18 0506 10     Pain Loc --      Pain Edu? --      Excl. in [place]? --     Constitutional: Alert and oriented. Appears very uncomfortable. Eyes: Conjunctivae are normal.  Head: Atraumatic. Mouth/Throat: Extensive chronic dental caries all throughout.  He has exquisite tenderness to the touch of tooth #28 in the lower right part of his jaw.  There is  no obvious swelling or abscess but it is very tender to palpation of the tooth itself as well as the surrounding gum and he does have some receding gumline in that area. Cardiovascular: Normal rate, regular rhythm. Good peripheral circulation. Respiratory: Normal respiratory effort.  No retractions.  Neurologic:  Normal speech and language. No gross focal neurologic deficits are appreciated.  Skin:  Skin is warm, dry and intact. No rash noted.  ____________________________________________   LABS (all labs ordered are listed, but only abnormal results are displayed)  Labs Reviewed - No data to display ____________________________________________  EKG  None - EKG not ordered by ED physician ____________________________________________  RADIOLOGY   ED MD interpretation: No indication for imaging  Official radiology report(s): No results found.  ____________________________________________   PROCEDURES  Critical Care performed: No   Procedure(s) performed:   Procedures   ____________________________________________   INITIAL IMPRESSION / ASSESSMENT AND PLAN / ED COURSE  As part of my medical decision making, I reviewed the following data within the [place] notes reviewed and  incorporated, Old chart reviewed and [place] Controlled Substance Database    The patient has what is likely an acute on chronic infection due to his very poor dental hygiene and chronic dental caries.  There is no evidence of an emergent odontogenic infection that would require IV antibiotics or transfer.  I am starting him on penicillin and giving a small number of pain pills to help but I stressed multiple times that he must go to a dentist and that this is a one-time treatment in our emergency department.  He drove tonight so I have ordered Toradol 30 mg intramuscular as well as some viscous lidocaine.  He understands and agrees with the plan.  He was given the dental resource guide.     ____________________________________________  FINAL CLINICAL IMPRESSION(S) / ED DIAGNOSES  Final diagnoses:  Pain, dental  Dental infection     MEDICATIONS GIVEN DURING THIS VISIT:  Medications  ketorolac (TORADOL) 30 MG/ML injection 30 mg (30 mg Intramuscular Given 09/28/18 0629)  lidocaine (XYLOCAINE) 2 % viscous mouth solution 15 mL (15 mLs Mouth/Throat Given 09/28/18 0321)     ED Discharge Orders         Ordered    HYDROcodone-acetaminophen (NORCO/VICODIN) 5-325 MG tablet  Every 6 hours PRN     09/28/18 0628    penicillin v potassium (VEETID) 500 MG tablet  4 times daily     09/28/18 2248           Note:  This document was prepared using Dragon voice recognition software and may include unintentional dictation errors.    Hinda Kehr, MD 09/28/18 [phone]

## 2018-09-28 NOTE — Discharge Instructions (Signed)
You have been seen in the Emergency Department (ED) today for dental pain.  Please take your prescribed antibiotic.  You may take pain medication as needed but ONLY as prescribed.  You should also take over-the-counter pain medication such as ibuprofen according to the label instructions unless a doctor has previously told you to avoid this type of medication (due to stomach ulcers, for example).  Alternatively you can take ibuprofen 600 mg by mouth three times daily with meals for no more than 5 days.  Take Norco as prescribed for severe pain. Do not drink alcohol, drive or participate in any other potentially dangerous activities while taking this medication as it may make you sleepy. Do not take this medication with any other sedating medications, either prescription or over-the-counter. If you were prescribed Percocet or Vicodin, do not take these with acetaminophen (Tylenol) as it is already contained within these medications.   This medication is an opiate (or narcotic) pain medication and can be habit forming.  Use it as little as possible to achieve adequate pain control.  Do not use or use it with extreme caution if you have a history of opiate abuse or dependence.  If you are on a pain contract with your primary care doctor or a pain specialist, be sure to let them know you were prescribed this medication today from the [facility] Emergency Department.  This medication is intended for your use only - do not give any to anyone else and keep it in a secure place where nobody else, especially children, have access to it.  It will also cause or worsen constipation, so you may want to consider taking an over-the-counter stool softener while you are taking this medication.  Please see you dentist as soon as possible; only a dentist will be able to fix your problem(s).  Please see below for dental follow up options.  Return to the ED if you develop worsening pain, fever, pus/drainage, difficulty  breathing, or other symptoms that concern you.

## 2018-10-27 ENCOUNTER — Other Ambulatory Visit: Payer: Self-pay | Admitting: Nurse Practitioner

## 2018-10-27 ENCOUNTER — Other Ambulatory Visit (HOSPITAL_COMMUNITY): Payer: Self-pay | Admitting: Nurse Practitioner

## 2018-10-28 ENCOUNTER — Telehealth: Payer: Self-pay | Admitting: *Deleted

## 2018-10-28 DIAGNOSIS — Z87891 Personal history of nicotine dependence: Secondary | ICD-10-CM

## 2018-10-28 DIAGNOSIS — Z122 Encounter for screening for malignant neoplasm of respiratory organs: Secondary | ICD-10-CM

## 2018-10-28 NOTE — Telephone Encounter (Signed)
Received referral for low dose lung cancer screening CT scan. Message left at phone number listed in EMR for patient to call me back to facilitate scheduling scan.  

## 2018-10-28 NOTE — Telephone Encounter (Signed)
Received referral for initial lung cancer screening scan. Contacted patient and obtained smoking history,(current 1ppd x 30years) as well as answering questions related to screening process. Patient denies signs of lung cancer such as weight loss or hemoptysis. Patient denies comorbidity that would prevent curative treatment if lung cancer were found. Patient is scheduled for shared decision making visit and CT scan on 11/15/18 at 130pm.

## 2018-11-03 ENCOUNTER — Emergency Department: Payer: Medicare Other

## 2018-11-03 ENCOUNTER — Other Ambulatory Visit: Payer: Self-pay

## 2018-11-03 ENCOUNTER — Encounter: Payer: Self-pay | Admitting: Emergency Medicine

## 2018-11-03 ENCOUNTER — Emergency Department
Admission: EM | Admit: 2018-11-03 | Discharge: 2018-11-03 | Disposition: A | Discharge status: Home / Self Care | Payer: Medicare Other | Attending: Emergency Medicine | Admitting: Emergency Medicine

## 2018-11-03 DIAGNOSIS — J209 Acute bronchitis, unspecified: Secondary | ICD-10-CM | POA: Insufficient documentation

## 2018-11-03 DIAGNOSIS — R05 Cough: Secondary | ICD-10-CM | POA: Diagnosis not present

## 2018-11-03 DIAGNOSIS — F17210 Nicotine dependence, cigarettes, uncomplicated: Secondary | ICD-10-CM | POA: Insufficient documentation

## 2018-11-03 DIAGNOSIS — I2510 Atherosclerotic heart disease of native coronary artery without angina pectoris: Secondary | ICD-10-CM | POA: Insufficient documentation

## 2018-11-03 DIAGNOSIS — Z955 Presence of coronary angioplasty implant and graft: Secondary | ICD-10-CM | POA: Insufficient documentation

## 2018-11-03 DIAGNOSIS — Z79899 Other long term (current) drug therapy: Secondary | ICD-10-CM | POA: Insufficient documentation

## 2018-11-03 DIAGNOSIS — R0602 Shortness of breath: Secondary | ICD-10-CM | POA: Diagnosis present

## 2018-11-03 DIAGNOSIS — R0789 Other chest pain: Secondary | ICD-10-CM | POA: Insufficient documentation

## 2018-11-03 DIAGNOSIS — Z7982 Long term (current) use of aspirin: Secondary | ICD-10-CM | POA: Diagnosis not present

## 2018-11-03 DIAGNOSIS — R071 Chest pain on breathing: Secondary | ICD-10-CM | POA: Insufficient documentation

## 2018-11-03 DIAGNOSIS — I10 Essential (primary) hypertension: Secondary | ICD-10-CM | POA: Diagnosis not present

## 2018-11-03 LAB — CBC
HCT: 40.8 % (ref 39.0–52.0)
Hemoglobin: 13.6 g/dL (ref 13.0–17.0)
MCH: 31.6 pg (ref 26.0–34.0)
MCHC: 33.3 g/dL (ref 30.0–36.0)
MCV: 94.7 fL (ref 80.0–100.0)
Platelets: 260 10*3/uL (ref 150–400)
RBC: 4.31 MIL/uL (ref 4.22–5.81)
RDW: 12.2 % (ref 11.5–15.5)
WBC: 9.3 10*3/uL (ref 4.0–10.5)
nRBC: 0 % (ref 0.0–0.2)

## 2018-11-03 LAB — BASIC METABOLIC PANEL
Anion gap: 7 (ref 5–15)
BUN: 9 mg/dL (ref 6–20)
CALCIUM: 8.3 mg/dL — AB (ref 8.9–10.3)
CO2: 28 mmol/L (ref 22–32)
CREATININE: 0.69 mg/dL (ref 0.61–1.24)
Chloride: 101 mmol/L (ref 98–111)
GFR calc Af Amer: >60 mL/min (ref >60)
GFR calc non Af Amer: >60 mL/min (ref >60)
Glucose, Bld: 101 mg/dL — ABNORMAL HIGH (ref 70–99)
Potassium: 4 mmol/L (ref 3.5–5.1)
Sodium: 136 mmol/L (ref 135–145)

## 2018-11-03 LAB — TROPONIN I: Troponin I: <0.03 ng/mL (ref <0.03)

## 2018-11-03 MED ORDER — PREDNISONE 20 MG PO TABS
60.0000 mg | ORAL_TABLET | Freq: Once | ORAL | Status: AC
  Administered 2018-11-03: 60 mg via ORAL
  Filled 2018-11-03: qty 3

## 2018-11-03 MED ORDER — SODIUM CHLORIDE 0.9% FLUSH: 3.0000 mL | Freq: Once | INTRAVENOUS | Status: DC

## 2018-11-03 MED ORDER — ALBUTEROL SULFATE HFA 108 (90 BASE) MCG/ACT IN AERS: 2.0000 | INHALATION_SPRAY | RESPIRATORY_TRACT | 0 refills | Status: DC | PRN

## 2018-11-03 MED ORDER — PREDNISONE 20 MG PO TABS: 60.0000 mg | ORAL_TABLET | Freq: Every day | ORAL | 0 refills | Status: AC

## 2018-11-03 MED ORDER — AZITHROMYCIN 250 MG PO TABS: ORAL_TABLET | ORAL | 0 refills | Status: DC

## 2018-11-03 MED ORDER — ALBUTEROL SULFATE (2.5 MG/3ML) 0.083% IN NEBU
2.5000 mg | INHALATION_SOLUTION | Freq: Once | RESPIRATORY_TRACT | Status: AC
  Administered 2018-11-03: 2.5 mg via RESPIRATORY_TRACT
  Filled 2018-11-03: qty 3

## 2018-11-03 MED ORDER — GUAIFENESIN-CODEINE 100-10 MG/5ML PO SOLN: 10.0000 mL | Freq: Four times a day (QID) | ORAL | 0 refills | Status: DC | PRN

## 2018-11-03 MED ORDER — IBUPROFEN 600 MG PO TABS
600.0000 mg | ORAL_TABLET | Freq: Once | ORAL | Status: AC
  Administered 2018-11-03: 600 mg via ORAL
  Filled 2018-11-03: qty 1

## 2018-11-03 NOTE — ED Provider Notes (Signed)
[facility] Emergency Department Provider Note ____________________________________________   First MD Initiated Contact with Patient 11/03/18 2029     (approximate)  I have reviewed the triage vital signs and the nursing notes.   HISTORY  Chief Complaint Shortness of Breath and Chest Pain    HPI [name] is a 58 y.o. male with PMH as noted below who presents with shortness of breath, gradual onset, worse with exertion, and associated with some sharp discomfort to the left chest when he takes a deep breath.  He is also had nonproductive cough, but denies associated fever.  The patient has a remote history of a collapsed lung and a subsequent PE after trauma, although he states this feels different.  He denies any leg pain or swelling.  Past Medical History:  Diagnosis Date  . CAD (coronary artery disease) 8/08   cath, bare metal  . DVT (deep venous thrombosis) ([place]) 9/09   left leg found after ortho surgery  . HTN (hypertension)   . Hyperlipidemia   . Hypogonadism male   . Liver laceration 9/09  . Pneumothorax   . Pulmonary embolus ([place]) 9/09  . 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 Rib fractures 9/09   x 3  . Splenic laceration 9/09    Patient Active Problem List   Diagnosis Date Noted  . Encounter for screening colonoscopy   . Polyp of sigmoid colon   . Diverticulosis of large intestine without diverticulitis   . Benign neoplasm of descending colon   . Benign neoplasm of ascending colon   . CAD (coronary artery disease), native coronary artery 10/29/2017  . Thoracic aorta atherosclerosis ([place]) 10/03/2017  . Cholelithiasis 10/03/2017  . Coronary artery calcification 10/03/2017  . Emphysema lung ([place]) 10/03/2017  . Depression 10/16/2015  . Chronic cervical radiculopathy 02/21/2015  . 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 ASCVD (arteriosclerotic cardiovascular disease) 01/18/2014   . Traumatic brain injury ([place]) 01/18/2014  . Chronic pain 01/18/2014  . Substance abuse ([place]) 12/12/2013  . Substance induced mood disorder ([place]) 12/12/2013  . 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 Osteoarthritis of left hip 11/15/2009  . GAD (generalized anxiety disorder) 05/22/2009  . MEMORY LOSS 05/22/2009  . Hyperlipidemia 12/03/2008  . TOBACCO USE 08/29/2008  . Essential hypertension 07/04/2008  . History of pulmonary embolus (PE) 07/04/2008  . INSOMNIA UNSPECIFIED 07/04/2008    Past Surgical History:  Procedure Laterality Date  . APPENDECTOMY    . BACK SURGERY    . CARDIAC CATHETERIZATION  8/08   90% focal R coronary, 80% R small obtuse marginal branch  . CERVICAL SPINE SURGERY  01/2009  . COLONOSCOPY WITH PROPOFOL N/A 05/25/2018   Procedure: COLONOSCOPY WITH PROPOFOL;  Surgeon: Virgel Manifold, MD;  Location: [facility] ENDOSCOPY;  Service: Endoscopy;  Laterality: N/A;  . CORONARY STENT PLACEMENT  05/2007   right main  . FRACTURE SURGERY  9/09   orif, screw, syndesmotic screw  . splenic cautery  0/09  . THORACENTESIS  9/09   chest tube, ICU    Prior to Admission medications   Medication Sig Start Date End Date Taking? Authorizing Provider  acetaminophen (TYLENOL) 500 MG tablet Take 1 tablet (500 mg total) by mouth every 6 (six) hours as needed. 09/17/17   Steele Sizer, MD  albuterol (PROVENTIL HFA;VENTOLIN HFA) 108 (90 Base) MCG/ACT inhaler Inhale 2 puffs into the lungs every 4 (four) hours as needed for wheezing or shortness  of breath. 11/03/18   Arta Silence, MD  aspirin EC 325 MG tablet Take 1 tablet (325 mg total) by mouth daily. 09/17/17   Steele Sizer, MD  azithromycin (ZITHROMAX Z-PAK) 250 MG tablet Take 2 tablets on day 1, then 1 tablet daily for 4 days 11/03/18   Arta Silence, MD  citalopram (CELEXA) 20 MG tablet Take 1 tablet (20 mg total) daily by mouth. 07/21/17   Roselee Nova, MD  guaiFENesin-codeine 100-10 MG/5ML  syrup Take 10 mLs by mouth every 6 (six) hours as needed for cough. 11/03/18   Arta Silence, MD  HYDROcodone-acetaminophen (NORCO/VICODIN) 5-325 MG tablet Take 2 tablets by mouth every 6 (six) hours as needed for moderate pain or severe pain. 09/28/18   Hinda Kehr, MD  ibuprofen (ADVIL,MOTRIN) 800 MG tablet Take 1 tablet (800 mg total) by mouth 3 (three) times daily. 01/04/18   Raylene Everts, MD  metoprolol succinate (TOPROL-XL) 100 MG 24 hr tablet Take 1 tablet (100 mg total) daily by mouth. 07/21/17   Roselee Nova, MD  oxyCODONE-acetaminophen (PERCOCET/ROXICET) 5-325 MG tablet Take 1 tablet by mouth every 6 (six) hours as needed. Patient not taking: Reported on 04/15/2018 01/25/18   Milton Ferguson, MD  predniSONE (DELTASONE) 20 MG tablet Take 3 tablets (60 mg total) by mouth daily for 4 days. 11/04/18 11/08/18  Arta Silence, MD  rosuvastatin (CRESTOR) 40 MG tablet Take 1 tablet (40 mg total) by mouth daily. 09/20/17   Steele Sizer, MD    Allergies No known allergies  Family History  Problem Relation Age of Onset  . Dementia Mother   . Congestive Heart Failure Mother   . Healthy Sister   . Healthy Brother   . Cancer Maternal Grandmother   . Heart attack Maternal Grandfather   . Healthy Sister     Social History Social History   Tobacco Use  . Smoking status: Current Every Day Smoker    Packs/day: 0.50    Years: 20.00    Pack years: 10.00    Types: Cigarettes    Start date: 10/18/1997  . Smokeless tobacco: Never Used  . Tobacco comment: using Nicoderm patches  Substance Use Topics  . Alcohol use: Yes    Comment: occassionally  . Drug use: No    Review of Systems  Constitutional: No fever. Eyes: No redness. ENT: No neck pain. Cardiovascular: Positive for chest pain. Respiratory: Positive for exertional shortness of breath. Gastrointestinal: No vomiting or diarrhea.  Genitourinary: Negative for flank pain.  Musculoskeletal: Negative for back  pain. Skin: Negative for rash. Neurological: Negative for headache.   ____________________________________________   PHYSICAL EXAM:  VITAL SIGNS: ED Triage Vitals  Enc Vitals Group     BP 11/03/18 1244 115/81     Pulse Rate 11/03/18 1244 69     Resp 11/03/18 1244 16     Temp 11/03/18 1244 98 F (36.7 C)     Temp Source 11/03/18 1244 Oral     SpO2 11/03/18 1244 94 %     Weight 11/03/18 1239 210 lb (95.3 kg)     Height 11/03/18 1239 6\' 2"  (1.88 m)     Head Circumference --      Peak Flow --      Pain Score 11/03/18 1239 4     Pain Loc --      Pain Edu? --      Excl. in [place]? --     Constitutional: Alert and oriented.  Relatively well appearing  and in no acute distress. Eyes: Conjunctivae are normal.  Head: Atraumatic. Nose: No congestion/rhinnorhea. Mouth/Throat: Mucous membranes are moist.   Neck: Normal range of motion.  Cardiovascular: Normal rate, regular rhythm. Grossly normal heart sounds.  Good peripheral circulation. Respiratory: Normal respiratory effort.  No retractions.  Slightly prolonged expiratory phase and scattered wheezes but lungs otherwise CTAB. Gastrointestinal: No distention.  Musculoskeletal: No lower extremity edema.  No calf or popliteal swelling or tenderness.  Extremities warm and well perfused.  Neurologic:  Normal speech and language. No gross focal neurologic deficits are appreciated.  Skin:  Skin is warm and dry. No rash noted. Psychiatric: Mood and affect are normal. Speech and behavior are normal.  ____________________________________________   LABS (all labs ordered are listed, but only abnormal results are displayed)  Labs Reviewed  BASIC METABOLIC PANEL - Abnormal; Notable for the following components:      Result Value   Glucose, Bld 101 (*)    Calcium 8.3 (*)    All other components within normal limits  CBC  TROPONIN I   ____________________________________________  EKG  ED ECG REPORT I, Arta Silence, the attending  physician, personally viewed and interpreted this ECG.  Date: 11/03/2018 EKG Time: 1242 Rate: 64 Rhythm: normal sinus rhythm QRS Axis: normal Intervals: normal ST/T Wave abnormalities: normal Narrative Interpretation: no evidence of acute ischemia  ____________________________________________  RADIOLOGY  CXR: Interstitial prominence with no focal infiltrate  ____________________________________________   PROCEDURES  Procedure(s) performed: No  Procedures  Critical Care performed: No ____________________________________________   INITIAL IMPRESSION / ASSESSMENT AND PLAN / ED COURSE  Pertinent labs & imaging results that were available during my care of the patient were reviewed by me and considered in my medical decision making (see chart for details).  58 year old male with PMH as noted above including CAD and a remote history of a PE related to trauma who presents with cough and some shortness of breath as well as atypical sharp chest pain over the last 4 days.  I reviewed the past medical records in Epic; the patient was most recently seen for a dental complaint last month.  There are no recent ED visits or admissions for symptoms related to what the patient reports today.  On exam the patient is overall well-appearing.  His vital signs are normal.  The remainder the exam is unremarkable except for some scattered wheezes on lung exam.  The patient has no respiratory distress.  Lab work-up obtained from triage is reassuring.  EKG is nonischemic.  Chest x-ray shows some interstitial opacities but no focal infiltrate.  The overall presentation is most consistent with viral bronchitis versus mild atypical pneumonia.  The symptoms are not consistent with ACS.  Although the patient does have a history of CAD, given the duration of the symptoms, the presence of cough and wheezing, and the normal EKG, the single troponin is sufficient to rule this out.  There is also no clinical  evidence to support PE given the lack of tachycardia or hypoxia, the lack of DVT symptoms, the lung exam, and the lack of ongoing PE risk factors.  I discussed this with the patient and he agrees with not pursuing a PE diagnosis further as I think a d-dimer would be likely to be falsely elevated.  There is no evidence of aortic dissection or other vascular etiology.  We will give bronchodilators and prednisone, as well as ibuprofen for symptomatic treatment.  Given the possibility of early atypical pneumonia we will send patient home  with azithromycin as well.  ----------------------------------------- 9:53 PM on 11/03/2018 -----------------------------------------  The patient is feeling well after albuterol.  He is stable for discharge home.  Return precautions given, and he expresses understanding.  Of note, the O2 saturation of 89% that was validated in Epic is erroneous and was likely when there was a poor quality waveform.  O2 saturation was in the mid 90s on room air when I was in the room. ____________________________________________   FINAL CLINICAL IMPRESSION(S) / ED DIAGNOSES  Final diagnoses:  Acute bronchitis, unspecified organism      NEW MEDICATIONS STARTED DURING THIS VISIT:  New Prescriptions   ALBUTEROL (PROVENTIL HFA;VENTOLIN HFA) 108 (90 BASE) MCG/ACT INHALER    Inhale 2 puffs into the lungs every 4 (four) hours as needed for wheezing or shortness of breath.   AZITHROMYCIN (ZITHROMAX Z-PAK) 250 MG TABLET    Take 2 tablets on day 1, then 1 tablet daily for 4 days   GUAIFENESIN-CODEINE 100-10 MG/5ML SYRUP    Take 10 mLs by mouth every 6 (six) hours as needed for cough.   PREDNISONE (DELTASONE) 20 MG TABLET    Take 3 tablets (60 mg total) by mouth daily for 4 days.     Note:  This document was prepared using Dragon voice recognition software and may include unintentional dictation errors.    Arta Silence, MD 11/03/18 2153

## 2018-11-03 NOTE — ED Notes (Signed)
Patient up walking around room, calm and cooperative. Paper pants provided and safety socks. Vss. Md to re-assess plans to discharge.

## 2018-11-03 NOTE — Discharge Instructions (Addendum)
Take the prednisone and azithromycin starting tomorrow and finish the full course of both.  You can use the albuterol every 4-6 hours as needed for shortness of breath or chest tightness and the guaifenesin/codeine as needed for cough.  You can also take ibuprofen or Tylenol for pain.  Return to the ER for new, worsening, persistent chest pain, shortness of breath, fevers, weakness, or any other new or worsening symptoms that concern you.

## 2018-11-03 NOTE — ED Triage Notes (Signed)
Chest paina nd shortness of breath for about 2 days.  midchest and increased with deep breath and feels sharp

## 2018-11-15 ENCOUNTER — Other Ambulatory Visit: Payer: Self-pay

## 2018-11-15 ENCOUNTER — Ambulatory Visit
Admission: RE | Admit: 2018-11-15 | Discharge: 2018-11-15 | Disposition: A | Discharge status: Home / Self Care | Payer: Medicare Other | Source: Ambulatory Visit | Attending: Oncology | Admitting: Oncology

## 2018-11-15 ENCOUNTER — Inpatient Hospital Stay: Payer: Medicare Other | Attending: Oncology | Admitting: Oncology

## 2018-11-15 ENCOUNTER — Encounter: Payer: Self-pay | Admitting: Oncology

## 2018-11-15 DIAGNOSIS — Z87891 Personal history of nicotine dependence: Secondary | ICD-10-CM | POA: Insufficient documentation

## 2018-11-15 DIAGNOSIS — F17210 Nicotine dependence, cigarettes, uncomplicated: Secondary | ICD-10-CM

## 2018-11-15 DIAGNOSIS — Z122 Encounter for screening for malignant neoplasm of respiratory organs: Secondary | ICD-10-CM | POA: Insufficient documentation

## 2018-11-15 NOTE — Progress Notes (Signed)
In accordance with CMS guidelines, patient has met eligibility criteria including age, absence of signs or symptoms of lung cancer.  Social History   Tobacco Use  . Smoking status: Current Every Day Smoker    Packs/day: 1.00    Years: 30.00    Pack years: 30.00    Types: Cigarettes    Start date: 10/18/1997  . Smokeless tobacco: Never Used  . Tobacco comment: using Nicoderm patches  Substance Use Topics  . Alcohol use: Yes    Comment: occassionally  . Drug use: No     A shared decision-making session was conducted prior to the performance of CT scan. This includes one or more decision aids, includes benefits and harms of screening, follow-up diagnostic testing, over-diagnosis, false positive rate, and total radiation exposure.  Counseling on the importance of adherence to annual lung cancer LDCT screening, impact of co-morbidities, and ability or willingness to undergo diagnosis and treatment is imperative for compliance of the program.  Counseling on the importance of continued smoking cessation for former smokers; the importance of smoking cessation for current smokers, and information about tobacco cessation interventions have been given to patient including [place] and [place] [facility] [place] programs.  Written order for lung cancer screening with LDCT has been given to the patient and any and all questions have been answered to the best of my abilities.   Yearly follow up will be coordinated by Burgess Estelle, Thoracic Navigator.  Faythe Casa, NP 11/15/2018 2:15 PM

## 2018-11-18 ENCOUNTER — Encounter: Payer: Self-pay | Admitting: *Deleted

## 2018-11-23 ENCOUNTER — Encounter: Payer: Self-pay | Admitting: *Deleted

## 2018-12-29 ENCOUNTER — Encounter: Payer: Self-pay | Admitting: Family Medicine

## 2019-04-08 IMAGING — CR DG CHEST 2V
1 series · 2 of 2 positions shown · non-contrast
Comparison: Chest CT 10/01/2017.  Chest x-ray 01/29/2015.

CLINICAL DATA: Chest pain.  Shortness of breath.

EXAM:
CHEST - 2 VIEW

[Series 1: dg chest 2 view · 0.14mm/px · 2 of 2 slices shown]
[im 1/2]
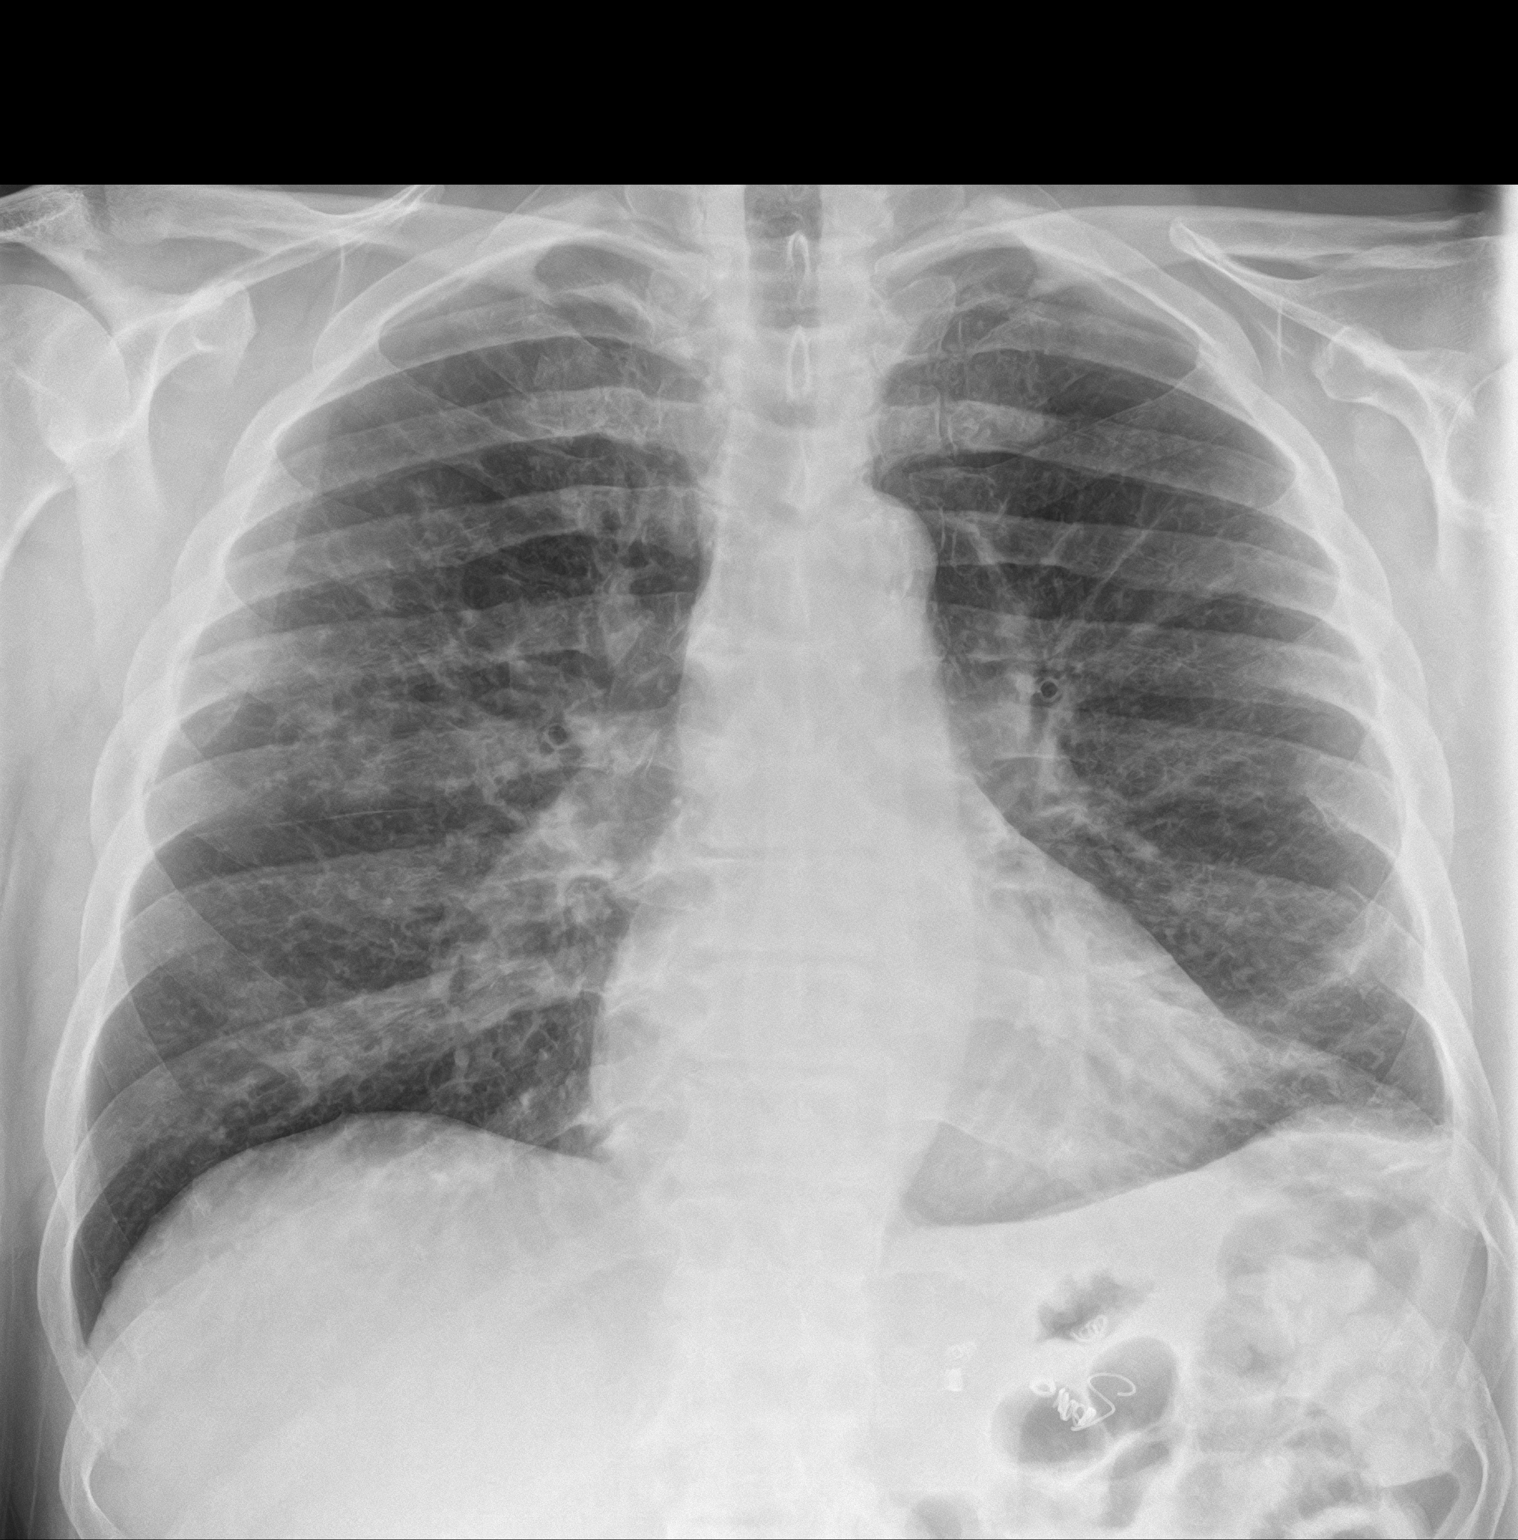
[im 2/2]
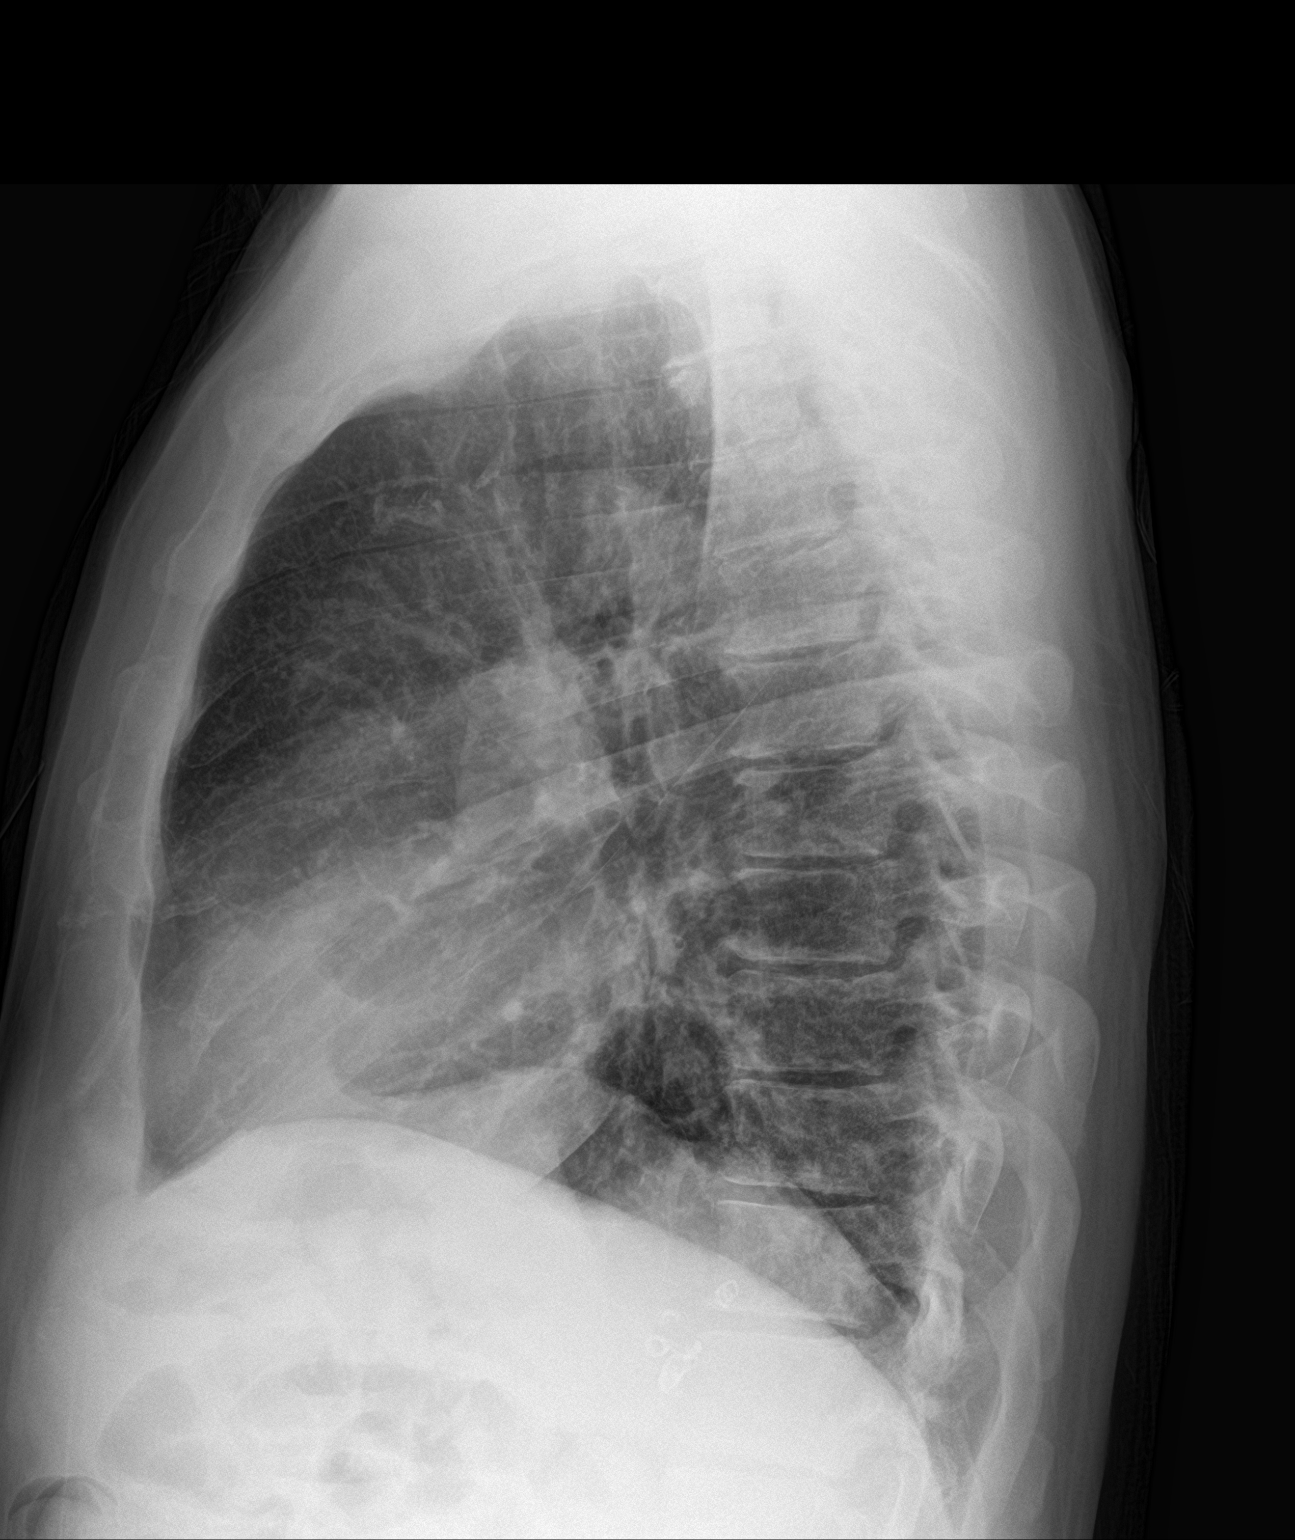

[2 of 2 positions shown; findings below may reference images not displayed]

FINDINGS: Heart size stable. Diffuse bilateral pulmonary interstitial
prominence consistent with pneumonitis noted. Mild left base
subsegmental atelectasis. No pleural effusion pneumothorax. Prior
cervical spine fusion. Surgical coils left upper quadrant. Old left
rib fractures.
IMPRESSION: Diffuse bilateral pulmonary interstitial prominence consistent with
pneumonitis.

## 2019-04-20 ENCOUNTER — Ambulatory Visit: Payer: Medicare Other

## 2019-10-11 ENCOUNTER — Encounter: Payer: Self-pay | Admitting: Nurse Practitioner

## 2019-10-11 ENCOUNTER — Ambulatory Visit: Payer: Medicare Other | Admitting: Nurse Practitioner
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
## 2019-10-12 ENCOUNTER — Encounter: Payer: Self-pay | Admitting: Nurse Practitioner
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
## 2019-11-14 ENCOUNTER — Encounter: Payer: Self-pay | Admitting: Cardiovascular Disease

## 2019-11-14 ENCOUNTER — Other Ambulatory Visit: Payer: Self-pay

## 2019-11-14 ENCOUNTER — Ambulatory Visit (INDEPENDENT_AMBULATORY_CARE_PROVIDER_SITE_OTHER): Payer: Medicare PPO | Admitting: Cardiovascular Disease

## 2019-11-14 VITALS — BP 120/70 | HR 63 | Ht 74.0 in | Wt 198.5 lb

## 2019-11-14 DIAGNOSIS — I25118 Atherosclerotic heart disease of native coronary artery with other forms of angina pectoris: Secondary | ICD-10-CM

## 2019-11-14 DIAGNOSIS — I700 Atherosclerosis of aorta: Secondary | ICD-10-CM | POA: Diagnosis not present

## 2019-11-14 DIAGNOSIS — I10 Essential (primary) hypertension: Secondary | ICD-10-CM

## 2019-11-14 DIAGNOSIS — E782 Mixed hyperlipidemia: Secondary | ICD-10-CM | POA: Diagnosis not present

## 2019-11-14 MED ORDER — NITROGLYCERIN 0.4 MG SL SUBL: 0.4000 mg | SUBLINGUAL_TABLET | SUBLINGUAL | 3 refills | Status: DC | PRN

## 2019-11-14 MED ORDER — ASPIRIN EC 81 MG PO TBEC: 81.0000 mg | DELAYED_RELEASE_TABLET | Freq: Every day | ORAL | 3 refills | Status: AC

## 2019-11-14 NOTE — Patient Instructions (Signed)

## 2019-11-15 ENCOUNTER — Telehealth: Payer: Self-pay | Admitting: *Deleted

## 2019-11-15 DIAGNOSIS — Z87891 Personal history of nicotine dependence: Secondary | ICD-10-CM

## 2019-11-15 NOTE — Telephone Encounter (Signed)
Patient has been notified that annual lung cancer screening low dose CT scan is due currently or will be in near future. Confirmed that patient is within the age range of 55-77, and asymptomatic, (no signs or symptoms of lung cancer). Patient denies illness that would prevent curative treatment for lung cancer if found. Verified smoking history, (current, 31 pack year). The shared decision making visit was done 11/15/18. Patient is agreeable for CT scan being scheduled.

## 2019-11-20 ENCOUNTER — Ambulatory Visit: Admission: RE | Admit: 2019-11-20 | Payer: Medicare PPO | Source: Ambulatory Visit

## 2019-11-28 ENCOUNTER — Telehealth: Payer: Self-pay | Admitting: *Deleted

## 2019-11-28 NOTE — Telephone Encounter (Signed)
(  11/28/19) Pt has been notified that lung cancer screening CT scan is due currently or will be in near future. Confirmed pt is within appropriate age range, and asymptomatic. Pt denies illness that would prevent curative treatment for lung cancer if found. Verified smoking history (Current Smoker,1 ppd ). Pt is agreeable for CT scan being scheduled and has no day or time preference. SRW

## 2019-12-04 ENCOUNTER — Other Ambulatory Visit: Payer: Self-pay

## 2019-12-04 ENCOUNTER — Ambulatory Visit
Admission: RE | Admit: 2019-12-04 | Discharge: 2019-12-04 | Disposition: A | Discharge status: Home / Self Care | Payer: Medicare PPO | Source: Ambulatory Visit | Attending: Oncology | Admitting: Oncology

## 2019-12-04 DIAGNOSIS — Z87891 Personal history of nicotine dependence: Secondary | ICD-10-CM | POA: Diagnosis present

## 2019-12-07 ENCOUNTER — Telehealth: Payer: Self-pay | Admitting: *Deleted

## 2019-12-07 NOTE — Telephone Encounter (Signed)
Left message in attempt to notify of Lung rads 3 finding.

## 2019-12-12 ENCOUNTER — Telehealth: Payer: Self-pay | Admitting: *Deleted

## 2019-12-12 NOTE — Telephone Encounter (Signed)
Notified patient of LDCT lung cancer screening program results with recommendation for 6 month follow up imaging. Also notified of incidental findings noted below and is encouraged to discuss further with PCP who will receive a copy of this note and/or the CT report. Patient verbalizes understanding.   IMPRESSION: 1. Lung-RADS 3S, probably benign findings. Short-term follow-up in 6 months is recommended with repeat low-dose chest CT without contrast (please use the following order, "CT CHEST LCS NODULE FOLLOW-UP W/O CM"). 2. The "S" modifier above refers to potentially clinically significant non lung cancer related findings. Specifically, there is aortic atherosclerosis, in addition to left main and 3 vessel coronary artery disease. Please note that although the presence of coronary artery calcium documents the presence of coronary artery disease, the severity of this disease and any potential stenosis cannot be assessed on this non-gated CT examination. Assessment for potential risk factor modification, dietary therapy or pharmacologic therapy may be warranted, if clinically indicated. 3. Mild diffuse bronchial wall thickening with very mild centrilobular and paraseptal emphysema; imaging findings suggestive of underlying COPD. 4. Cholelithiasis.  Aortic Atherosclerosis (ICD10-I70.0) and Emphysema

## 2020-05-07 ENCOUNTER — Emergency Department
Admission: EM | Admit: 2020-05-07 | Discharge: 2020-05-08 | Disposition: A | Discharge status: Home / Self Care | Payer: Medicare PPO | Attending: Emergency Medicine | Admitting: Emergency Medicine

## 2020-05-07 ENCOUNTER — Emergency Department: Payer: Medicare PPO

## 2020-05-07 ENCOUNTER — Other Ambulatory Visit: Payer: Self-pay

## 2020-05-07 DIAGNOSIS — S42102A Fracture of unspecified part of scapula, left shoulder, initial encounter for closed fracture: Secondary | ICD-10-CM | POA: Diagnosis not present

## 2020-05-07 DIAGNOSIS — S4992XA Unspecified injury of left shoulder and upper arm, initial encounter: Secondary | ICD-10-CM | POA: Diagnosis present

## 2020-05-07 DIAGNOSIS — R1031 Right lower quadrant pain: Secondary | ICD-10-CM | POA: Insufficient documentation

## 2020-05-07 DIAGNOSIS — Z79899 Other long term (current) drug therapy: Secondary | ICD-10-CM | POA: Diagnosis not present

## 2020-05-07 DIAGNOSIS — S22009A Unspecified fracture of unspecified thoracic vertebra, initial encounter for closed fracture: Secondary | ICD-10-CM | POA: Insufficient documentation

## 2020-05-07 DIAGNOSIS — Y990 Civilian activity done for income or pay: Secondary | ICD-10-CM | POA: Insufficient documentation

## 2020-05-07 DIAGNOSIS — Z951 Presence of aortocoronary bypass graft: Secondary | ICD-10-CM | POA: Insufficient documentation

## 2020-05-07 DIAGNOSIS — W19XXXA Unspecified fall, initial encounter: Secondary | ICD-10-CM

## 2020-05-07 DIAGNOSIS — R0789 Other chest pain: Secondary | ICD-10-CM | POA: Diagnosis not present

## 2020-05-07 DIAGNOSIS — Y939 Activity, unspecified: Secondary | ICD-10-CM | POA: Insufficient documentation

## 2020-05-07 DIAGNOSIS — Z23 Encounter for immunization: Secondary | ICD-10-CM | POA: Diagnosis not present

## 2020-05-07 DIAGNOSIS — R519 Headache, unspecified: Secondary | ICD-10-CM | POA: Diagnosis not present

## 2020-05-07 DIAGNOSIS — Z85038 Personal history of other malignant neoplasm of large intestine: Secondary | ICD-10-CM | POA: Insufficient documentation

## 2020-05-07 DIAGNOSIS — I10 Essential (primary) hypertension: Secondary | ICD-10-CM | POA: Insufficient documentation

## 2020-05-07 DIAGNOSIS — I2510 Atherosclerotic heart disease of native coronary artery without angina pectoris: Secondary | ICD-10-CM | POA: Diagnosis not present

## 2020-05-07 DIAGNOSIS — W1789XA Other fall from one level to another, initial encounter: Secondary | ICD-10-CM | POA: Insufficient documentation

## 2020-05-07 DIAGNOSIS — Z7982 Long term (current) use of aspirin: Secondary | ICD-10-CM | POA: Insufficient documentation

## 2020-05-07 DIAGNOSIS — S22008A Other fracture of unspecified thoracic vertebra, initial encounter for closed fracture: Secondary | ICD-10-CM

## 2020-05-07 DIAGNOSIS — M791 Myalgia, unspecified site: Secondary | ICD-10-CM | POA: Insufficient documentation

## 2020-05-07 DIAGNOSIS — Y929 Unspecified place or not applicable: Secondary | ICD-10-CM | POA: Insufficient documentation

## 2020-05-07 DIAGNOSIS — F17210 Nicotine dependence, cigarettes, uncomplicated: Secondary | ICD-10-CM | POA: Diagnosis not present

## 2020-05-07 LAB — CBC
HCT: 40.8 % (ref 39.0–52.0)
Hemoglobin: 14 g/dL (ref 13.0–17.0)
MCH: 32.6 pg (ref 26.0–34.0)
MCHC: 34.3 g/dL (ref 30.0–36.0)
MCV: 94.9 fL (ref 80.0–100.0)
Platelets: 347 10*3/uL (ref 150–400)
RBC: 4.3 MIL/uL (ref 4.22–5.81)
RDW: 11.9 % (ref 11.5–15.5)
WBC: 10.3 10*3/uL (ref 4.0–10.5)
nRBC: 0 % (ref 0.0–0.2)

## 2020-05-07 LAB — COMPREHENSIVE METABOLIC PANEL
ALT: 24 U/L (ref 0–44)
AST: 27 U/L (ref 15–41)
Albumin: 4.7 g/dL (ref 3.5–5.0)
Alkaline Phosphatase: 79 U/L (ref 38–126)
Anion gap: 10 (ref 5–15)
BUN: 39 mg/dL — ABNORMAL HIGH (ref 6–20)
CO2: 29 mmol/L (ref 22–32)
Calcium: 9.5 mg/dL (ref 8.9–10.3)
Chloride: 99 mmol/L (ref 98–111)
Creatinine, Ser: 0.71 mg/dL (ref 0.61–1.24)
GFR calc Af Amer: >60 mL/min (ref >60)
GFR calc non Af Amer: >60 mL/min (ref >60)
Glucose, Bld: 106 mg/dL — ABNORMAL HIGH (ref 70–99)
Potassium: 4.5 mmol/L (ref 3.5–5.1)
Sodium: 138 mmol/L (ref 135–145)
Total Bilirubin: 0.5 mg/dL (ref 0.3–1.2)
Total Protein: 8.1 g/dL (ref 6.5–8.1)

## 2020-05-07 LAB — SAMPLE TO BLOOD BANK

## 2020-05-07 LAB — ETHANOL: Alcohol, Ethyl (B): <10 mg/dL (ref <10)

## 2020-05-07 LAB — PROTIME-INR
INR: 0.9 (ref 0.8–1.2)
Prothrombin Time: 12 seconds (ref 11.4–15.2)

## 2020-05-07 MED ORDER — OXYCODONE-ACETAMINOPHEN 5-325 MG PO TABS: 1.0000 | ORAL_TABLET | Freq: Three times a day (TID) | ORAL | 0 refills | Status: AC | PRN

## 2020-05-07 MED ORDER — OXYCODONE-ACETAMINOPHEN 5-325 MG PO TABS
1.0000 | ORAL_TABLET | Freq: Once | ORAL | Status: AC
  Administered 2020-05-07: 1 via ORAL
  Filled 2020-05-07: qty 1

## 2020-05-07 MED ORDER — TETANUS-DIPHTH-ACELL PERTUSSIS 5-2.5-18.5 LF-MCG/0.5 IM SUSP
0.5000 mL | Freq: Once | INTRAMUSCULAR | Status: AC
  Administered 2020-05-07: 0.5 mL via INTRAMUSCULAR
  Filled 2020-05-07: qty 0.5

## 2020-05-07 MED ORDER — HYDROMORPHONE HCL 1 MG/ML IJ SOLN
1.0000 mg | Freq: Once | INTRAMUSCULAR | Status: AC
  Administered 2020-05-07: 1 mg via INTRAVENOUS
  Filled 2020-05-07: qty 1

## 2020-05-07 MED ORDER — NICOTINE 21 MG/24HR TD PT24
21.0000 mg | MEDICATED_PATCH | Freq: Once | TRANSDERMAL | Status: DC
  Administered 2020-05-07: 21 mg via TRANSDERMAL
  Filled 2020-05-07: qty 1

## 2020-05-07 MED ORDER — LACTATED RINGERS IV BOLUS
1000.0000 mL | Freq: Once | INTRAVENOUS | Status: AC
  Administered 2020-05-07: 1000 mL via INTRAVENOUS

## 2020-05-07 MED ORDER — IOHEXOL 300 MG/ML  SOLN
100.0000 mL | Freq: Once | INTRAMUSCULAR | Status: AC | PRN
  Administered 2020-05-07: 100 mL via INTRAVENOUS
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
## 2020-05-07 NOTE — ED Provider Notes (Signed)
[facility] Emergency Department Provider Note  ____________________________________________   First MD Initiated Contact with Patient 05/07/20 2122     (approximate)  I have reviewed the triage vital signs and the nursing notes.   HISTORY  Chief Complaint Fall   HPI [name] is a 59 y.o. 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  Denies LOC.  States he fell onto his left chest and may have hit his head although is not exactly sure where.  He also states he has some pain in his right lower quadrant of his abdomen and a cut on his right lower leg.  Prior to this he has been in his usual state health without any recent fevers, chills, cough, nausea, vomiting, diarrhea, dysuria, abdominal pain, back pain, rash, or other acute physical complaints.  Denies EtOH or illicit drug use.         Past Medical History:  Diagnosis Date  . CAD (coronary artery disease)    a. 05/2007 Cath/PCI: LM 30d, LAD 20 diffuse, RI 20p, LCX nl, OM1-small, 80p, RCA-large/dominant, 49m (4.0x15 Vision BMS), 20 diffuse. EF 55%.  . Chronic pain   . DVT (deep venous thrombosis) ([place]) 9/09   left leg found after ortho surgery  . HTN (hypertension)   . Hyperlipidemia   . Hypogonadism male   . Liver laceration 9/09  . Pneumothorax   . Pulmonary embolus ([place]) 9/09  . Pulmonary nodules   . 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 Rib fractures 9/09   x 3  . Splenic laceration 9/09  . Tobacco abuse     Patient Active Problem List   Diagnosis Date Noted  . Encounter for screening colonoscopy   . Polyp of sigmoid colon   . Diverticulosis of large intestine without diverticulitis   . Benign neoplasm of descending colon   . Benign neoplasm of ascending colon   . CAD  (coronary artery disease), native coronary artery 10/29/2017  . Thoracic aorta atherosclerosis ([place]) 10/03/2017  . Cholelithiasis 10/03/2017  . Coronary artery calcification 10/03/2017  . Emphysema lung ([place]) 10/03/2017  . Depression 10/16/2015  . Chronic cervical radiculopathy 02/21/2015  . 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 ASCVD (arteriosclerotic cardiovascular disease) 01/18/2014  . Traumatic brain injury ([place]) 01/18/2014  . Chronic pain 01/18/2014  . Substance abuse ([place]) 12/12/2013  . Substance induced mood disorder ([place]) 12/12/2013  . 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 Osteoarthritis of left hip 11/15/2009  . GAD (generalized anxiety disorder) 05/22/2009  . MEMORY LOSS 05/22/2009  . Hyperlipidemia 12/03/2008  . TOBACCO USE 08/29/2008  . Essential hypertension 07/04/2008  . History of pulmonary embolus (PE) 07/04/2008  . INSOMNIA UNSPECIFIED 07/04/2008    Past Surgical History:  Procedure Laterality Date  . APPENDECTOMY    . BACK SURGERY    . CARDIAC CATHETERIZATION  8/08   90% focal R coronary, 80% R small obtuse marginal branch  . CERVICAL SPINE SURGERY  01/2009  . COLONOSCOPY WITH PROPOFOL N/A 05/25/2018   Procedure: COLONOSCOPY WITH PROPOFOL;  Surgeon: Virgel Manifold, MD;  Location: [facility] ENDOSCOPY;  Service: Endoscopy;  Laterality: N/A;  . CORONARY STENT PLACEMENT  05/2007   right main  . FRACTURE SURGERY  9/09   orif, screw, syndesmotic screw  .  splenic cautery  0/09  . THORACENTESIS  9/09   chest tube, ICU    Prior to Admission medications   Medication Sig Start Date End Date Taking? Authorizing Provider  acetaminophen (TYLENOL) 500 MG tablet Take 1 tablet (500 mg total) by mouth every 6 (six) hours as needed. 09/17/17   Steele Sizer, MD  amphetamine-dextroamphetamine (ADDERALL XR) 20 MG 24 hr capsule daily. 10/24/19   [provider]  aspirin EC 81 MG tablet Take 1 tablet (81 mg total)  by mouth daily. 11/14/19   Minna Merritts, MD  metoprolol succinate (TOPROL-XL) 100 MG 24 hr tablet Take 1 tablet (100 mg total) daily by mouth. 07/21/17   Roselee Nova, MD  nitroGLYCERIN (NITROSTAT) 0.4 MG SL tablet Place 1 tablet (0.4 mg total) under the tongue every 5 (five) minutes as needed for chest pain. 11/14/19   Minna Merritts, MD  oxyCODONE-acetaminophen (PERCOCET) 5-325 MG tablet Take 1 tablet by mouth every 8 (eight) hours as needed for up to 5 days for severe pain. 05/07/20 05/12/20  Lucrezia Starch, MD  rosuvastatin (CRESTOR) 40 MG tablet Take 1 tablet (40 mg total) by mouth daily. 09/20/17   Steele Sizer, MD    Allergies No known allergies  Family History  Problem Relation Age of Onset  . Dementia Mother   . Congestive Heart Failure Mother   . Healthy Sister   . Healthy Brother   . Cancer Maternal Grandmother   . Heart attack Maternal Grandfather   . Healthy Sister     Social History Social History   Tobacco Use  . Smoking status: Current Every Day Smoker    Packs/day: 1.00    Years: 30.00    Pack years: 30.00    Types: Cigarettes    Start date: 10/18/1997  . Smokeless tobacco: Never Used  . Tobacco comment: using Nicoderm patches  Vaping Use  . Vaping Use: Never used  Substance Use Topics  . Alcohol use: Yes    Comment: occassionally  . Drug use: No    Review of Systems  Review of Systems  Constitutional: Negative for chills and fever.  HENT: Negative for sore throat.   Eyes: Negative for pain.  Respiratory: Negative for cough and stridor.   Cardiovascular: Positive for chest pain ( L chest).  Gastrointestinal: Negative for vomiting.  Musculoskeletal: Positive for myalgias ( L chest, R lower leg).  Skin: Negative for rash.  Neurological: Positive for headaches. Negative for seizures and loss of consciousness.  Psychiatric/Behavioral: Negative for suicidal ideas.  All other systems reviewed and are negative.      ____________________________________________   PHYSICAL EXAM:  VITAL SIGNS: ED Triage Vitals  Enc Vitals Group     BP 05/07/20 1925 107/82     Pulse Rate 05/07/20 1924 71     Resp 05/07/20 1923 (!) 22     Temp 05/07/20 1924 98.7 F (37.1 C)     Temp Source 05/07/20 1924 Oral     SpO2 05/07/20 1924 96 %     Weight 05/07/20 1923 210 lb (95.3 kg)     Height 05/07/20 1923 6\' 2"  (1.88 m)     Head Circumference --      Peak Flow --      Pain Score 05/07/20 1923 8     Pain Loc --      Pain Edu? --      Excl. in [place]? --    Vitals:   05/07/20 2200 05/07/20 2230  BP: 131/83 128/86  Pulse: 66 62  Resp: 16 16  Temp:    SpO2: 97% 97%   Physical Exam Vitals and nursing note reviewed.  Constitutional:      Appearance: He is well-developed.  HENT:     Head: Normocephalic and atraumatic.     Right Ear: External ear normal.     Left Ear: External ear normal.     Nose: Nose normal.     Mouth/Throat:     Mouth: Mucous membranes are moist.  Eyes:     Conjunctiva/sclera: Conjunctivae normal.  Cardiovascular:     Rate and Rhythm: Normal rate and regular rhythm.     Heart sounds: No murmur heard.   Pulmonary:     Effort: Pulmonary effort is normal. No respiratory distress.     Breath sounds: Normal breath sounds.  Abdominal:     Palpations: Abdomen is soft.     Tenderness: There is no abdominal tenderness.  Musculoskeletal:     Cervical back: Neck supple.     Right lower leg: No edema.     Left lower leg: No edema.  Skin:    General: Skin is warm and dry.     Capillary Refill: Capillary refill takes less than 2 seconds.  Neurological:     Mental Status: He is alert and oriented to person, place, and time.  Psychiatric:        Mood and Affect: Mood normal.     Tenderness over the left chest.  Mild tenderness of the right lower quadrant abdomen.  There is a small linear hemostatic laceration approximately 1 cm in diameter in the anterior right lower leg.  Patient has  full range of motion and no tenderness, effusion, deformity, or other overlying skin changes over the bilateral shoulders, elbows, wrists, hips, knees, or ankles.  2+ bilateral radial and DP pulses.  No tenderness over the C or L spine but some TTP over T spine.  PERRLA.  EOMI.  Sensation intact light touch throughout all extremities. ____________________________________________   LABS (all labs ordered are listed, but only abnormal results are displayed)  Labs Reviewed  COMPREHENSIVE METABOLIC PANEL - Abnormal; Notable for the following components:      Result Value   Glucose, Bld 106 (*)    BUN 39 (*)    All other components within normal limits  SARS CORONAVIRUS 2 BY RT PCR (HOSPITAL ORDER, [facility])  CBC  ETHANOL  PROTIME-INR  LACTIC ACID, PLASMA  SAMPLE TO BLOOD BANK   ____________________________________________  EKG  Sinus rhythm with a ventricular rate of 74, normal axis, unremarkable intervals, Q waves in lead III and nonspecific ST changes in lead aVF with no other clear evidence of acute ischemia or other significant underlying arrhythmia. ____________________________________________  RADIOLOGY   Official radiology report(s): DG Ribs Unilateral W/Chest Left  Result Date: 05/07/2020 CLINICAL DATA:  Status post fall. EXAM: LEFT RIBS AND CHEST - 3+ VIEW COMPARISON:  None. FINDINGS: Acute fracture deformity is seen involving the superior aspect of the left scapula. Acute seventh, eighth and ninth left rib fractures are noted. A radiopaque fusion plate and screws are seen overlying the lower cervical spine. There is no evidence of pneumothorax or pleural effusion. Both lungs are clear. Heart size and mediastinal contours are within normal limits. Multiple radiopaque surgical coils are seen overlying the left upper quadrant. IMPRESSION: 1. Acute fracture of the left scapula. 2. Acute seventh, eighth and ninth left rib fractures. Electronically Signed  By: Virgina Norfolk M.D.   On: 05/07/2020 20:09   DG Tibia/Fibula Right  Result Date: 05/07/2020 CLINICAL DATA:  Fall EXAM: RIGHT TIBIA AND FIBULA - 2 VIEW COMPARISON:  None. FINDINGS: There is no evidence of fracture or other focal bone lesions. Soft tissues are unremarkable. IMPRESSION: Negative. Electronically Signed   By: Donavan Foil M.D.   On: 05/07/2020 22:16   DG Ankle 2 Views Right  Result Date: 05/07/2020 CLINICAL DATA:  Fall EXAM: RIGHT ANKLE - 2 VIEW COMPARISON:  None. FINDINGS: No fracture or malalignment. [place] or old injury adjacent to the medial malleolus. IMPRESSION: No acute osseous abnormality. Electronically Signed   By: Donavan Foil M.D.   On: 05/07/2020 22:16   CT Head Wo Contrast  Result Date: 05/07/2020 CLINICAL DATA:  Status post trauma. EXAM: CT HEAD WITHOUT CONTRAST TECHNIQUE: Contiguous axial images were obtained from the base of the skull through the vertex without intravenous contrast. COMPARISON:  April 02, 2013 FINDINGS: Brain: No evidence of acute infarction, hemorrhage, hydrocephalus, extra-axial collection or mass lesion/mass effect. Vascular: No hyperdense vessel or unexpected calcification. Skull: Normal. Negative for fracture or focal lesion. Sinuses/Orbits: No acute finding. Other: None. IMPRESSION: No acute intracranial pathology. Electronically Signed   By: Virgina Norfolk M.D.   On: 05/07/2020 19:50   CT Cervical Spine Wo Contrast  Result Date: 05/07/2020 CLINICAL DATA:  Status post trauma. EXAM: CT CERVICAL SPINE WITHOUT CONTRAST TECHNIQUE: Multidetector CT imaging of the cervical spine was performed without intravenous contrast. Multiplanar CT image reconstructions were also generated. COMPARISON:  None. FINDINGS: Alignment: Normal. Skull base and vertebrae: No acute fracture. No primary bone lesion or focal pathologic process. A metallic density fusion plate and screws are seen along the anterior aspects of the C5, C6 and C7 vertebral bodies. Soft  tissues and spinal canal: No prevertebral fluid or swelling. No visible canal hematoma. Disc levels: Moderate to marked severity anterior osteophyte formation is seen at the level of C4-C5 with mild anterior osteophyte formation noted at the levels of C2-C3 and C3-C4. Surgical fusion of the C5-C6 and C6-C7 levels is noted. Marked severity bilateral multilevel facet joint hypertrophy is noted. Upper chest: Negative. Other: None. IMPRESSION: 1. No acute osseous abnormality of the cervical spine. 2. Surgical fusion of the C5-C6 and C6-C7 levels. 3. Moderate to marked severity anterior osteophyte formation at the level of C4-C5 with mild anterior osteophyte formation at the levels of C2-C3 and C3-C4. 4. Marked severity bilateral multilevel facet joint hypertrophy is noted. Electronically Signed   By: Virgina Norfolk M.D.   On: 05/07/2020 19:54   CT CHEST ABDOMEN PELVIS W CONTRAST  Addendum Date: 05/07/2020   ADDENDUM REPORT: 05/07/2020 23:17 ADDENDUM: Acute nondisplaced left transverse process fractures at T3 and T4, are better seen on the CT thoracic spine views. Electronically Signed   By: Donavan Foil M.D.   On: 05/07/2020 23:17   Result Date: 05/07/2020 CLINICAL DATA:  Trauma, fell 7 feet landing on left chest and shoulder left-sided chest pain EXAM: CT CHEST, ABDOMEN, AND PELVIS WITH CONTRAST TECHNIQUE: Multidetector CT imaging of the chest, abdomen and pelvis was performed following the standard protocol during bolus administration of intravenous contrast. CONTRAST:  173mL OMNIPAQUE IOHEXOL 300 MG/ML  SOLN COMPARISON:  Radiographs 05/07/2020, CT 12/04/2019, 01/25/2018, 10/01/2017, chest x-ray 11/03/2018 FINDINGS: CT CHEST FINDINGS Cardiovascular: Moderate aortic atherosclerosis. No aneurysm. Coronary vascular calcification. Normal heart size. No significant pericardial effusion. Mediastinum/Nodes: Midline trachea. No thyroid mass. Subcentimeter mediastinal lymph nodes. Esophagus within normal limits  Lungs/Pleura: No pneumothorax. No pleural effusion. Small focus of atelectasis at the lingula. Musculoskeletal: Sternum is intact. The vertebral body heights are grossly maintained. Chronic sclerosis of the T1, T2 and T3 vertebral bodies. Acute superior left scapular fracture that appears to involve the supraspinatus fossa and coracoid process. Fracture does not definitively extend to the articular surface of the glenoid. Chronic appearing left seventh, eighth ninth and tenth posterolateral rib fractures. CT ABDOMEN PELVIS FINDINGS Hepatobiliary: Multiple calcified gallstones. No focal hepatic abnormality. No biliary dilatation Pancreas: Unremarkable. No pancreatic ductal dilatation or surrounding inflammatory changes. Spleen: Lobulated splenic contour. Stable calcified mass within the upper aspect of the spleen. Adrenals/Urinary Tract: Adrenal glands are normal. Kidneys show no hydronephrosis. The bladder is unremarkable. Stomach/Bowel: Stomach is within normal limits. Scattered left colon diverticula without acute inflammatory process. No evidence of bowel wall thickening, distention, or inflammatory changes. Vascular/Lymphatic: Moderate aortic atherosclerosis without aneurysm. Vascular coils in the left upper quadrant. Reproductive: Slightly enlarged prostate Other: Negative for free air or free fluid. Musculoskeletal: No pelvic fracture is visualized. See separately dictated spine report. IMPRESSION: 1. No CT evidence for acute intrathoracic, intra-abdominal, or intrapelvic abnormality. 2. Acute superior left scapular fracture that appears to involve the supraspinatus fossa and coracoid process. Fracture does not definitively extend to the articular surface of the glenoid. 3. Chronic appearing left-sided rib fractures. No pneumothorax, pleural effusion or significant airspace disease. 4. Gallstones. 5. 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 Aortic Atherosclerosis (ICD10-I70.0). Electronically Signed: By: Donavan Foil M.D. On: 05/07/2020 23:02   CT T-SPINE NO CHARGE  Result Date: 05/07/2020 CLINICAL DATA:  [place] 7 feet EXAM: CT THORACIC SPINE WITHOUT CONTRAST TECHNIQUE: Multidetector CT images of the thoracic were obtained from previously performed CT chest abdomen and pelvis. No additional contrast administered COMPARISON:  CT 12/04/2019 FINDINGS: Alignment: Normal. Vertebrae: Chronic sclerosis at T1, T2 and T3 vertebral bodies. Acute nondisplaced left transverse process fractures at T3 and T4. Vertebral body heights are grossly maintained. Paraspinal and other soft tissues: Negative. Disc levels: Mild diffuse degenerative osteophytes. No bony canal stenosis. IMPRESSION: Acute nondisplaced left transverse process fractures at T3 and T4. Electronically Signed   By: Donavan Foil M.D.   On: 05/07/2020 23:15   CT L-SPINE NO CHARGE  Result Date: 05/07/2020 CLINICAL DATA:  [place] 7 feet EXAM: CT LUMBAR SPINE WITHOUT CONTRAST TECHNIQUE: Multi planar CT images of the lumbar spine were acquired from previously performed CT chest, abdomen and pelvis. No additional contrast was administered. COMPARISON:  CT 01/25/2018 FINDINGS: Segmentation: 5 lumbar type vertebrae. Alignment: Normal. Vertebrae: No acute fracture or focal pathologic process. Paraspinal and other soft tissues: Negative. Disc levels: At T12 L1, mild vacuum discs. No significant canal stenosis. The foramen are patent bilaterally. At L1-L2, mild disc space narrowing with vacuum disc. No significant canal stenosis. Mild facet degenerative change. Mild left inferior foraminal narrowing. At L2-L3, mild disc space narrowing. No significant canal stenosis. Mild facet arthropathy. Mild bilateral foraminal narrowing. At L3-L4, minimal disc space narrowing. Broad-based disc bulge. Mild canal stenosis. Facet hypertrophic degenerative change with thickening of ligamentum flavum. Mild bilateral foraminal narrowing. At L4-L5, mild disc space narrowing. Moderate diffuse disc  bulge. Mild to moderate canal stenosis. Facet hypertrophic degenerative change with ligamentum flavum thickening. Mild to moderate bilateral foraminal narrowing. At L5 S1, moderate disc space narrowing. No significant canal stenosis. Hypertrophic facet degenerative change. Possible left laminotomy. Mild to moderate bilateral foraminal narrowing. IMPRESSION: 1. No CT evidence for acute osseous abnormality. 2. Multilevel degenerative changes. Electronically  Signed   By: Donavan Foil M.D.   On: 05/07/2020 23:09   DG Shoulder Left  Result Date: 05/07/2020 CLINICAL DATA:  Status post fall. EXAM: LEFT SHOULDER - 2+ VIEW COMPARISON:  April 02, 2013 FINDINGS: Acute fracture deformity is seen involving the superior aspect of the left scapula. There is no evidence of dislocation. There is no evidence of arthropathy or other focal bone abnormality. Soft tissues are unremarkable. IMPRESSION: Acute fracture of the left scapula. Electronically Signed   By: Virgina Norfolk M.D.   On: 05/07/2020 20:06    ____________________________________________   PROCEDURES  Procedure(s) performed (including Critical Care):  Procedures   ____________________________________________   INITIAL IMPRESSION / ASSESSMENT AND PLAN / ED COURSE        Patient presents with above to history exam for assessment after mechanical fall off a ladder.  Patient is afebrile and hemodynamically stable on room air on arrival.  Exam as above.  However patient's history, exam, and ED work-up is consistent with an acute left scapular fracture as well as acute transverse process fractures of thoracic area as noted above.  No other evidence of acute injury in the chest abdomen or pelvis although patient is noted to have evidence of chronic left-sided rib fractures from prior trauma.  CT head and C-spine unremarkable.  Plain films of extremities are unremarkable patient has no evidence of neurovascular deficit or other significant injury on  exam.  Given stable vital signs with otherwise reassuring work-up Patient is a for discharge with plan for close outpatient orthopedic follow-up.  Patient was placed in sling and Rx written for Percocet.  Discharged stable condition.  Strict return precautions advised and discussed. ____________________________________________   FINAL CLINICAL IMPRESSION(S) / ED DIAGNOSES  Final diagnoses:  Fall  Closed fracture of left scapula, unspecified part of scapula, initial encounter  Closed fracture of spinous process of thoracic vertebra, initial encounter ([place])    Medications  nicotine (NICODERM CQ - dosed in mg/24 hours) patch 21 mg (21 mg Transdermal Patch Applied 05/07/20 2205)  HYDROmorphone (DILAUDID) injection 1 mg (1 mg Intravenous Given 05/07/20 2146)  Tdap (BOOSTRIX) injection 0.5 mL (0.5 mLs Intramuscular Given 05/07/20 2144)  lactated ringers bolus 1,000 mL (1,000 mLs Intravenous New Bag/Given 05/07/20 2205)  iohexol (OMNIPAQUE) 300 MG/ML solution 100 mL (100 mLs Intravenous Contrast Given 05/07/20 2209)  oxyCODONE-acetaminophen (PERCOCET/ROXICET) 5-325 MG per tablet 1 tablet (1 tablet Oral Given 05/07/20 2308)     ED Discharge Orders         Ordered    oxyCODONE-acetaminophen (PERCOCET) 5-325 MG tablet  Every 8 hours PRN        05/07/20 2329           Note:  This document was prepared using Dragon voice recognition software and may include unintentional dictation errors.   Lucrezia Starch, MD 05/07/20 [phone]

## 2020-05-07 NOTE — ED Notes (Signed)
Pt ambulated in hall with steady gait to bathroom.

## 2020-05-31 ENCOUNTER — Telehealth: Payer: Self-pay | Admitting: *Deleted

## 2020-05-31 NOTE — Telephone Encounter (Signed)
(  05/31/2020) Left message for pt to notify them that it is time to schedule annual low dose lung cancer screening CT scan. Instructed patient to call back to verify information prior to the scan being scheduled [facility]

## 2020-06-13 ENCOUNTER — Other Ambulatory Visit: Payer: Self-pay | Admitting: *Deleted

## 2020-06-13 DIAGNOSIS — R918 Other nonspecific abnormal finding of lung field: Secondary | ICD-10-CM

## 2020-06-13 DIAGNOSIS — Z87891 Personal history of nicotine dependence: Secondary | ICD-10-CM

## 2020-06-13 NOTE — Progress Notes (Signed)
Contacted and scheduled for LCS Nodule follow up, current smoker, 31 pack year history.

## 2020-06-19 ENCOUNTER — Ambulatory Visit: Payer: Medicare PPO

## 2020-06-27 ENCOUNTER — Other Ambulatory Visit: Payer: Self-pay | Admitting: *Deleted

## 2020-06-27 DIAGNOSIS — R918 Other nonspecific abnormal finding of lung field: Secondary | ICD-10-CM

## 2020-06-27 DIAGNOSIS — Z87891 Personal history of nicotine dependence: Secondary | ICD-10-CM

## 2020-07-02 ENCOUNTER — Ambulatory Visit: Admission: RE | Admit: 2020-07-02 | Payer: Medicare PPO | Source: Ambulatory Visit

## 2020-07-02 ENCOUNTER — Ambulatory Visit: Payer: Medicare PPO

## 2020-07-11 ENCOUNTER — Telehealth: Payer: Self-pay | Admitting: *Deleted

## 2020-07-11 NOTE — Telephone Encounter (Signed)
Attempted to contact and schedule lung screening scan. Message left for patient to call back to schedule. 

## 2020-07-30 ENCOUNTER — Telehealth: Payer: Self-pay | Admitting: *Deleted

## 2020-07-30 NOTE — Telephone Encounter (Signed)
Attempted to contact and schedule lung screening scan. Message left for patient to call back to schedule. 

## 2020-09-11 ENCOUNTER — Encounter: Payer: Self-pay | Admitting: *Deleted

## 2020-11-05 ENCOUNTER — Other Ambulatory Visit (HOSPITAL_COMMUNITY): Payer: Self-pay | Admitting: Surgery

## 2020-11-05 ENCOUNTER — Other Ambulatory Visit: Payer: Self-pay | Admitting: Surgery

## 2020-11-05 DIAGNOSIS — K8020 Calculus of gallbladder without cholecystitis without obstruction: Secondary | ICD-10-CM

## 2020-11-20 ENCOUNTER — Encounter
Admission: RE | Admit: 2020-11-20 | Discharge: 2020-11-20 | Disposition: A | Discharge status: Home / Self Care | Payer: Medicare PPO | Source: Ambulatory Visit | Attending: Surgery | Admitting: Surgery

## 2020-11-20 ENCOUNTER — Other Ambulatory Visit: Payer: Self-pay

## 2020-11-20 DIAGNOSIS — K8020 Calculus of gallbladder without cholecystitis without obstruction: Secondary | ICD-10-CM | POA: Insufficient documentation

## 2020-11-20 MED ORDER — TECHNETIUM TC 99M MEBROFENIN IV KIT: 5.0000 | PACK | Freq: Once | INTRAVENOUS | Status: DC | PRN

## 2020-11-26 ENCOUNTER — Other Ambulatory Visit: Payer: Self-pay

## 2020-11-26 ENCOUNTER — Ambulatory Visit
Admission: RE | Admit: 2020-11-26 | Discharge: 2020-11-26 | Disposition: A | Discharge status: Home / Self Care | Payer: Medicare PPO | Source: Ambulatory Visit | Attending: Surgery | Admitting: Surgery

## 2020-11-26 DIAGNOSIS — K8020 Calculus of gallbladder without cholecystitis without obstruction: Secondary | ICD-10-CM | POA: Insufficient documentation

## 2020-11-26 MED ORDER — TECHNETIUM TC 99M MEBROFENIN IV KIT
4.9720 | PACK | Freq: Once | INTRAVENOUS | Status: AC | PRN
  Administered 2020-11-26: 4.972 via INTRAVENOUS
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
## 2020-12-09 ENCOUNTER — Ambulatory Visit: Payer: Medicare PPO | Admitting: Cardiovascular Disease

## 2020-12-09 ENCOUNTER — Other Ambulatory Visit: Payer: Self-pay

## 2020-12-09 ENCOUNTER — Encounter: Payer: Self-pay | Admitting: Cardiovascular Disease

## 2020-12-09 VITALS — BP 120/64 | HR 80 | Ht 74.0 in | Wt 190.2 lb

## 2020-12-09 DIAGNOSIS — I10 Essential (primary) hypertension: Secondary | ICD-10-CM | POA: Diagnosis not present

## 2020-12-09 DIAGNOSIS — I25118 Atherosclerotic heart disease of native coronary artery with other forms of angina pectoris: Secondary | ICD-10-CM

## 2020-12-09 DIAGNOSIS — I700 Atherosclerosis of aorta: Secondary | ICD-10-CM | POA: Diagnosis not present

## 2020-12-09 DIAGNOSIS — E782 Mixed hyperlipidemia: Secondary | ICD-10-CM | POA: Diagnosis not present

## 2020-12-09 MED ORDER — METOPROLOL SUCCINATE ER 25 MG PO TB24: 25.0000 mg | ORAL_TABLET | Freq: Every day | ORAL | 3 refills | Status: DC

## 2020-12-09 MED ORDER — EZETIMIBE 10 MG PO TABS: 10.0000 mg | ORAL_TABLET | Freq: Every day | ORAL | 3 refills | Status: DC

## 2020-12-09 NOTE — Patient Instructions (Addendum)
Medication Instructions:  Zetia 10 mg daily for cholesterol  Restart metoprolol succinate 1/2 pill up to whole a day  If you need a refill on your cardiac medications before your next appointment, please call your pharmacy.    Lab work: No new labs needed   If you have labs (blood work) drawn today and your tests are completely normal, you will receive your results only by: [place] MyChart Message (if you have MyChart) OR . A paper copy in the mail If you have any lab test that is abnormal or we need to change your treatment, we will call you to review the results.   Testing/Procedures: No new testing needed   Follow-Up: At [facility], you and your health needs are our priority.  As part of our continuing mission to provide you with exceptional heart care, we have created designated Provider Care Teams.  These Care Teams include your primary Cardiologist (physician) and Advanced Practice Providers (APPs -  Physician Assistants and Nurse Practitioners) who all work together to provide you with the care you need, when you need it.  . You will need a follow up appointment in 12 months  . Providers on your designated Care Team:   . Murray Hodgkins, NP . Christell Faith, PA-C . Marrianne Mood, PA-C  Any Other Special Instructions Will Be Listed Below (If Applicable).  COVID-19 Vaccine Information can be found at: [email or web address].uk For questions related to vaccine distribution or appointments, please email [email or web address] or call [phone].

## 2020-12-10 ENCOUNTER — Other Ambulatory Visit: Payer: Self-pay | Admitting: Oncology

## 2020-12-10 DIAGNOSIS — Z87891 Personal history of nicotine dependence: Secondary | ICD-10-CM

## 2020-12-10 DIAGNOSIS — R918 Other nonspecific abnormal finding of lung field: Secondary | ICD-10-CM

## 2021-04-24 ENCOUNTER — Telehealth: Payer: Self-pay | Admitting: Cardiovascular Disease

## 2021-04-24 NOTE — Telephone Encounter (Signed)
Called back and spoke with [name].  He does not have any NTG on hand.  BP 144/81 HR 85 O2 sats 99% with continued SOB.  Pt does confirm s/s noted below.  Pt is concerned with incr SOB and nausea, states "something just is not right."  Wife is now with pt. I advised pt go to emergency room. Pt confirmed that he will have his wife take him now to [facility].   Will forward to Dr. Rockey Situ to make aware.

## 2021-04-24 NOTE — Telephone Encounter (Signed)
Spoke to pt's wife [name] (DPR approved).  She is en route to pt at this time. Wife states pt was helping a friend and called her to let her know he was having shortness of breath and to call our office. Per wife, pt has had SOB for two weeks and pt has just let her know this.  Pt also w/ nausea (without vomiting) that is new onset recently.  Per wife, pt "does have h/o gallstones that can act up from time to time, but he feels this is not related to his nausea."  Per wife, pt's s/s are similar to how he felt prior to having stent placed in 2008.  Pt last seen by Dr. Rockey Situ 12/09/20. PMH includes CAD, prior stent to the RCA 2008, and emphysema.  Pt quit smoking March 2022, wife confirms this is correct still.  Pt was scheduled prior to triage receiving this call for office visit with Cadence Furth, [place] 04/28/21 for SOB.   Wife is on way to pt and is going to check his BP and bring his SL NTG.  Pt/wife is going to call me back with BP and response to NTG x 1.  Advised in the meantime, if pt SOB/ nausea continues to go to the emergency room.  Wife voiced understanding. Otherwise, she will call back shortly with update once she is with pt.

## 2021-04-24 NOTE — Telephone Encounter (Signed)
Pt c/o Shortness Of Breath: STAT if SOB developed within the last 24 hours or pt is noticeably SOB on the phone  1. Are you currently SOB (can you hear that pt is SOB on the phone)? yes  2. How long have you been experiencing SOB? Feeling this way for the last week   3. Are you SOB when sitting or when up moving around? Moving around   4. Are you currently experiencing any other symptoms? No

## 2021-04-24 NOTE — Telephone Encounter (Signed)
Patient wife calling for asap refill on nitro patient is out

## 2021-04-25 ENCOUNTER — Other Ambulatory Visit: Payer: Self-pay | Admitting: Cardiovascular Disease

## 2021-04-25 MED ORDER — ROSUVASTATIN CALCIUM 40 MG PO TABS
40.0000 mg | ORAL_TABLET | Freq: Every day | ORAL | 3 refills | Status: DC
Start: 2021-04-25 — End: 2022-03-31

## 2021-04-25 MED ORDER — NITROGLYCERIN 0.4 MG SL SUBL: 0.4000 mg | SUBLINGUAL_TABLET | SUBLINGUAL | 3 refills | Status: DC | PRN

## 2021-04-26 NOTE — Progress Notes (Signed)
Cardiology Office Note:    Date:  04/29/2021   ID:  [name], DOB [birth year], MRN [id number]  PCP:  Oscar Loron, NP  [facility] Cardiologist:  Ida Rogue, MD  [facility] Electrophysiologist:  None   Referring MD: Oscar Loron, NP   Chief Complaint: shortness of breath  History of Present Illness:    [name] is a 60 y.o. male with a hx of CAD s/p stent to the RCA 2008, aortic atherosclerosis, HTN, emphysema, cholelithiasis, depression, chronic pain, tobacco use, HLD who presents for follow-up.   Carotid US 2014 sowed mild b/l plaque, <3%  Last seen 12/09/20, quit smoking, stopped BB since script ran out, symptomatically stable. Started on zetia  Today, he reports episode of severe shortness of breath last week. He was going up a ladder and It was really hot that day. He became really short of breath. He sat down and it subsided. He drank water. No dizziness or lightheadedness. No chest pain. Has not felt like this before. He feels normal now. Generally does not exert himself, but not as much as that day. He does not formal activity, but stays active. He is a smoker, trying to quit. He is using the patches. No alcohol or drug use. Denies lower leg edema, orthopnea, pnd, palpitations. EKG shows SR with some nonspecific t wave changes V2, aVL.  Past Medical History:  Diagnosis Date   CAD (coronary artery disease)    a. 05/2007 Cath/PCI: LM 30d, LAD 20 diffuse, RI 20p, LCX nl, OM1-small, 80p, RCA-large/dominant, 36m(4.0x15 Vision BMS), 20 diffuse. EF 55%.   Chronic pain    [place]   left leg found after ortho surgery   HTN (hypertension)    Hyperlipidemia    Hypogonadism male    Liver laceration 9/09   Pneumothorax    Pulmonary embolus (HCC) 9/09   Pulmonary nodules    Retroperitoneal hematoma 01/2009   left, iliopsoas hematoma   Rib fractures 9/09   x 3   Splenic laceration 9/09   Tobacco abuse     Past Surgical History:   Procedure Laterality Date   APPENDECTOMY     BACK SURGERY     CARDIAC CATHETERIZATION  8/08   90% focal R coronary, 80% R small obtuse marginal branch   CERVICAL SPINE SURGERY  01/2009   COLONOSCOPY WITH PROPOFOL N/A 05/25/2018   Procedure: COLONOSCOPY WITH PROPOFOL;  Surgeon: TVirgel Manifold MD;  Location: [facility] ENDOSCOPY;  Service: Endoscopy;  Laterality: N/A;   CORONARY STENT PLACEMENT  05/2007   right main   FRACTURE SURGERY  9/09   orif, screw, syndesmotic screw   splenic cautery  0/09   THORACENTESIS  9/09   chest tube, ICU    Current Medications: Current Meds  Medication Sig   acetaminophen (TYLENOL) 500 MG tablet Take 1 tablet (500 mg total) by mouth every 6 (six) hours as needed.   amphetamine-dextroamphetamine (ADDERALL XR) 20 MG 24 hr capsule daily.   aspirin EC 81 MG tablet Take 1 tablet (81 mg total) by mouth daily.   Buprenorphine HCl-Naloxone HCl 8-2 MG FILM    ezetimibe (ZETIA) 10 MG tablet Take 1 tablet (10 mg total) by mouth daily.   metoprolol succinate (TOPROL-XL) 25 MG 24 hr tablet Take 1 tablet (25 mg total) by mouth daily. Take with or immediately following a meal.   nitroGLYCERIN (NITROSTAT) 0.4 MG SL tablet Place 1 tablet (0.4 mg total) under the tongue every  5 (five) minutes as needed for chest pain.   rosuvastatin (CRESTOR) 40 MG tablet Take 1 tablet (40 mg total) by mouth daily.     Allergies:   No known allergies   Social History   Socioeconomic History   Marital status: Married    Spouse name: [name]   Number of children: 2   Years of education: Not on file   Highest education level: 12th grade  Occupational History   Occupation: Disabled  Tobacco Use   Smoking status: Former    Packs/day: 1.00    Years: 30.00    Pack years: 30.00    Types: Cigarettes    Start date: 10/18/1997   Smokeless tobacco: Never   Tobacco comments:    using Nicoderm patches  Vaping Use   Vaping Use: Never used  Substance and Sexual Activity   Alcohol use:  Yes    Comment: occassionally   Drug use: No   Sexual activity: Yes    Partners: Female    Birth control/protection: None  Other Topics Concern   Not on file  Social History Narrative   Not on file   Social Determinants of Health   Financial Resource Strain: Not on file  Food Insecurity: Not on file  Transportation Needs: Not on file  Physical Activity: Not on file  Stress: Not on file  Social Connections: Not on file     Family History: The patient's family history includes Cancer in his maternal grandmother; Congestive Heart Failure in his mother; Dementia in his mother; Healthy in his brother, sister, and sister; Heart attack in his maternal grandfather.  ROS:   Please see the history of present illness.     All other systems reviewed and are negative.  EKGs/Labs/Other Studies Reviewed:    The following studies were reviewed today:  N/A  EKG:  EKG is  ordered today.  The ekg ordered today demonstrates NSR, 70bpm, T wave changes aVL and V2  Recent Labs: 04/28/2021: ALT 17; BUN 23; Creatinine, Ser 0.91; Hemoglobin 13.4; Platelets 327; Potassium 4.5; Sodium 140; TSH 1.870  Recent Lipid Panel    Component Value Date/Time   CHOL 114 04/28/2021 1441   CHOL 173 04/04/2013 0628   TRIG 149 04/28/2021 1441   TRIG 396 (H) 04/04/2013 0628   HDL 27 (L) 04/28/2021 1441   HDL 14 (L) 04/04/2013 0628   CHOLHDL 4.2 04/28/2021 1441   CHOLHDL 9.6 (H) 09/17/2017 1601   VLDL 55 (H) 12/23/2016 1025   VLDL 79 (H) 04/04/2013 0628   LDLCALC 61 04/28/2021 1441   LDLCALC 142 (H) 09/17/2017 1601   LDLCALC 80 04/04/2013 0628   LDLDIRECT 66 04/28/2021 1441   LDLDIRECT 124.9 11/15/2009 1100    Physical Exam:    VS:  BP 124/88 (BP Location: Left Arm)   Pulse 70   Ht '6\' 2"'$  (1.88 m)   Wt 189 lb 12.8 oz (86.1 kg)   BMI 24.37 kg/m     Wt Readings from Last 3 Encounters:  04/28/21 189 lb 12.8 oz (86.1 kg)  12/09/20 190 lb 4 oz (86.3 kg)  05/07/20 210 lb (95.3 kg)     GEN:  Well  nourished, well developed in no acute distress HEENT: Normal NECK: No JVD; No carotid bruits LYMPHATICS: No lymphadenopathy CARDIAC: RRR, no murmurs, rubs, gallops RESPIRATORY:  minimal wheezing ABDOMEN: Soft, non-tender, non-distended MUSCULOSKELETAL:  No edema; No deformity  SKIN: Warm and dry NEUROLOGIC:  Alert and oriented x 3 PSYCHIATRIC:  Normal affect  ASSESSMENT:    1. Shortness of breath   2. Coronary artery disease of native artery of native heart with stable angina pectoris ([place])   3. Thoracic aorta atherosclerosis ([place])   4. Essential hypertension   5. Mixed hyperlipidemia    PLAN:    In order of problems listed above:  Shortness of breath CAD s/p stent RCA in 2008 Patient reports episode of severe shortness of breath on exertion. No chest pain reported.History of stenting in 2008, no reports to review and no ischemic work-up since that time. EKG shows nonspecific t wave changes. Given CAD with prior stenting, continued smoking and EKG changes I will check Myoview stress test and echo. Check TSH, CMET, CBC.  Tobacco use/emphysema Patient is trying to quit smoking, using the patches intermittently. Complete cessation advised.   HLD LDL 123 2018. Re-check lipid panel. on Zetia and Crestor '40mg'$  daily.   HTN BP normotensive. Continue current regimen   Disposition: Follow up in 2 month(s) with MD/APP   Shared Decision Making/Informed Consent   Shared Decision Making/Informed Consent The risks [chest pain, shortness of breath, cardiac arrhythmias, dizziness, blood pressure fluctuations, myocardial infarction, stroke/transient ischemic attack, nausea, vomiting, allergic reaction, radiation exposure, metallic taste sensation and life-threatening complications (estimated to be 1 in 10,000)], benefits (risk stratification, diagnosing coronary artery disease, treatment guidance) and alternatives of a nuclear stress test were discussed in detail with Mr. Schoenbachler and he  agrees to proceed.    Signed, Equilla Que Ninfa Meeker, PA-C  04/29/2021 11:47 AM    [facility]

## 2021-04-28 ENCOUNTER — Encounter: Payer: Self-pay | Admitting: Medical

## 2021-04-28 ENCOUNTER — Other Ambulatory Visit: Payer: Self-pay

## 2021-04-28 ENCOUNTER — Ambulatory Visit: Payer: Medicare PPO | Admitting: Medical

## 2021-04-28 VITALS — BP 124/88 | HR 70 | Ht 74.0 in | Wt 189.8 lb

## 2021-04-28 DIAGNOSIS — I700 Atherosclerosis of aorta: Secondary | ICD-10-CM | POA: Diagnosis not present

## 2021-04-28 DIAGNOSIS — E782 Mixed hyperlipidemia: Secondary | ICD-10-CM

## 2021-04-28 DIAGNOSIS — I25118 Atherosclerotic heart disease of native coronary artery with other forms of angina pectoris: Secondary | ICD-10-CM

## 2021-04-28 DIAGNOSIS — R0602 Shortness of breath: Secondary | ICD-10-CM

## 2021-04-28 DIAGNOSIS — I10 Essential (primary) hypertension: Secondary | ICD-10-CM | POA: Diagnosis not present

## 2021-04-28 NOTE — Patient Instructions (Signed)
Medication Instructions:  No changes at this time.   *If you need a refill on your cardiac medications before your next appointment, please call your pharmacy*   Lab Work: CBC, CMET, TSH, Lipid, and Direct LDL today  If you have labs (blood work) drawn today and your tests are completely normal, you will receive your results only by: [place] (if you have MyChart) OR A paper copy in the mail If you have any lab test that is abnormal or we need to change your treatment, we will call you to review the results.   Testing/Procedures: Your physician has requested that you have an echocardiogram. Echocardiography is a painless test that uses sound waves to create images of your heart. It provides your doctor with information about the size and shape of your heart and how well your heart's chambers and valves are working. This procedure takes approximately one hour. There are no restrictions for this procedure.  [place]  Your caregiver has ordered a Stress Test with nuclear imaging. The purpose of this test is to evaluate the blood supply to your heart muscle. This procedure is referred to as a "Non-Invasive Stress Test." This is because other than having an IV started in your vein, nothing is inserted or "invades" your body. Cardiac stress tests are done to find areas of poor blood flow to the heart by determining the extent of coronary artery disease (CAD). Some patients exercise on a treadmill, which naturally increases the blood flow to your heart, while others who are  unable to walk on a treadmill due to physical limitations have a pharmacologic/chemical stress agent called Lexiscan . This medicine will mimic walking on a treadmill by temporarily increasing your coronary blood flow.   Please note: these test may take anywhere between 2-4 hours to complete  PLEASE REPORT TO [place] AT THE FIRST DESK WILL DIRECT YOU WHERE TO GO  Date of  Procedure:_____________________________________  Arrival Time for Procedure:______________________________    PLEASE NOTIFY THE OFFICE AT LEAST 24 HOURS IN ADVANCE IF YOU ARE UNABLE TO KEEP YOUR APPOINTMENT.  [phone] AND  PLEASE NOTIFY NUCLEAR MEDICINE AT [facility] AT LEAST 24 HOURS IN ADVANCE IF YOU ARE UNABLE TO KEEP YOUR APPOINTMENT. [phone]  How to prepare for your Myoview test:  Do not eat or drink after midnight No caffeine for 24 hours prior to test No smoking 24 hours prior to test. Your medication may be taken with water.  If your doctor stopped a medication because of this test, do not take that medication. Ladies, please do not wear dresses.  Skirts or pants are appropriate. Please wear a short sleeve shirt. No perfume, cologne or lotion. Wear comfortable walking shoes. No heels!     Follow-Up: At [facility], you and your health needs are our priority.  As part of our continuing mission to provide you with exceptional heart care, we have created designated Provider Care Teams.  These Care Teams include your primary Cardiologist (physician) and Advanced Practice Providers (APPs -  Physician Assistants and Nurse Practitioners) who all work together to provide you with the care you need, when you need it.  We recommend signing up for the patient portal called "MyChart".  Sign up information is provided on this After Visit Summary.  MyChart is used to connect with patients for Virtual Visits (Telemedicine).  Patients are able to view lab/test results, encounter notes, upcoming appointments, etc.  Non-urgent messages can be sent to your  provider as well.   To learn more about what you can do with MyChart, go to [email or web address].    Your next appointment:   2 month(s)  The format for your next appointment:   In Person  Provider:   You may see Ida Rogue, MD or one of the following Advanced Practice Providers on your designated Care Team:   Murray Hodgkins, NP Christell Faith, PA-C Marrianne Mood, PA-C Cadence Doran, [place]

## 2021-04-29 LAB — COMPREHENSIVE METABOLIC PANEL
ALT: 17 IU/L (ref 0–44)
AST: 21 IU/L (ref 0–40)
Albumin/Globulin Ratio: 2 (ref 1.2–2.2)
Albumin: 4.7 g/dL (ref 3.8–4.9)
Alkaline Phosphatase: 94 IU/L (ref 44–121)
BUN/Creatinine Ratio: 25 — ABNORMAL HIGH (ref 10–24)
BUN: 23 mg/dL (ref 8–27)
Bilirubin Total: 0.4 mg/dL (ref 0.0–1.2)
CO2: 24 mmol/L (ref 20–29)
Calcium: 9.4 mg/dL (ref 8.6–10.2)
Chloride: 102 mmol/L (ref 96–106)
Creatinine, Ser: 0.91 mg/dL (ref 0.76–1.27)
Globulin, Total: 2.3 g/dL (ref 1.5–4.5)
Glucose: 96 mg/dL (ref 65–99)
Potassium: 4.5 mmol/L (ref 3.5–5.2)
Sodium: 140 mmol/L (ref 134–144)
Total Protein: 7 g/dL (ref 6.0–8.5)
eGFR: 96 mL/min/{1.73_m2} (ref >59)

## 2021-04-29 LAB — CBC
Hematocrit: 40 % (ref 37.5–51.0)
Hemoglobin: 13.4 g/dL (ref 13.0–17.7)
MCH: 32 pg (ref 26.6–33.0)
MCHC: 33.5 g/dL (ref 31.5–35.7)
MCV: 96 fL (ref 79–97)
Platelets: 327 10*3/uL (ref 150–450)
RBC: 4.19 x10E6/uL (ref 4.14–5.80)
RDW: 12.3 % (ref 11.6–15.4)
WBC: 8.4 10*3/uL (ref 3.4–10.8)

## 2021-04-29 LAB — LDL CHOLESTEROL, DIRECT: LDL Direct: 66 mg/dL (ref 0–99)

## 2021-04-29 LAB — LIPID PANEL
Chol/HDL Ratio: 4.2 ratio (ref 0.0–5.0)
Cholesterol, Total: 114 mg/dL (ref 100–199)
HDL: 27 mg/dL — ABNORMAL LOW (ref >39)
LDL Chol Calc (NIH): 61 mg/dL (ref 0–99)
Triglycerides: 149 mg/dL (ref 0–149)
VLDL Cholesterol Cal: 26 mg/dL (ref 5–40)

## 2021-04-29 LAB — TSH: TSH: 1.87 u[IU]/mL (ref 0.450–4.500)

## 2021-04-30 NOTE — Telephone Encounter (Signed)
Pt had OV w/Cadence 8/22 (refer to office notes) Labs order at [facility] (completed) 9/1 Myoview order 9/22 Echo order

## 2021-05-08 ENCOUNTER — Encounter: Admission: RE | Admit: 2021-05-08 | Payer: Medicare PPO | Source: Ambulatory Visit

## 2021-05-28 ENCOUNTER — Other Ambulatory Visit: Payer: Self-pay | Admitting: Medical

## 2021-05-28 DIAGNOSIS — R0600 Dyspnea, unspecified: Secondary | ICD-10-CM

## 2021-05-28 DIAGNOSIS — I25118 Atherosclerotic heart disease of native coronary artery with other forms of angina pectoris: Secondary | ICD-10-CM

## 2021-05-29 ENCOUNTER — Other Ambulatory Visit: Payer: Medicare PPO

## 2021-06-25 ENCOUNTER — Telehealth: Payer: Self-pay

## 2021-06-25 NOTE — Telephone Encounter (Signed)
-----   Message from [name] sent at 06/25/2021  2:04 PM EDT ----- Regarding: needs testing Patient has F/U appointment scheduled with Cadence Furth on 07/04/21 but did not show for echo or myoview. Please reschedule testing and push back appointment if necessary. Thank you!

## 2021-06-25 NOTE — Telephone Encounter (Signed)
L mom to cancel follow up appointment and reschedule myoview and echo

## 2021-07-04 ENCOUNTER — Ambulatory Visit: Payer: Medicare PPO | Admitting: Medical

## 2021-11-10 ENCOUNTER — Encounter: Payer: Self-pay | Admitting: Cardiovascular Disease

## 2022-03-18 ENCOUNTER — Ambulatory Visit (INDEPENDENT_AMBULATORY_CARE_PROVIDER_SITE_OTHER): Payer: Medicare PPO

## 2022-03-18 ENCOUNTER — Encounter: Payer: Self-pay | Admitting: Podiatry

## 2022-03-18 ENCOUNTER — Ambulatory Visit: Payer: Medicare PPO | Admitting: Podiatry

## 2022-03-18 DIAGNOSIS — L84 Corns and callosities: Secondary | ICD-10-CM | POA: Diagnosis not present

## 2022-03-18 DIAGNOSIS — M2042 Other hammer toe(s) (acquired), left foot: Secondary | ICD-10-CM

## 2022-03-21 NOTE — Progress Notes (Signed)
  Subjective:  Patient ID: [name], male    DOB: 1961-08-08,  MRN: [id number]  Chief Complaint  Patient presents with   Toe Pain    "My little toe on my left foot has been killing me for about two months."    62 y.o. male presents with the above complaint. History confirmed with patient.  He feels like it is on the inside of the fifth toe that rubs against the fourth toe  Objective:  Physical Exam: warm, good capillary refill, no trophic changes or ulcerative lesions, normal DP and PT pulses, normal sensory exam, and distal fifth toe interdigital corn, there is a adductovarus rotation of the toe noted.   Radiographs: Multiple views x-ray of the left foot:  Hammertoe deformities noted in the left foot with adductovarus rotation of the fifth toe Assessment:   1. Hammer toe of left foot   2. Callus of foot      Plan:  Patient was evaluated and treated and all questions answered.  Discussed development of the hyperkeratotic lesion due to the deformity of the toe.  We discussed surgical and nonsurgical treatment options.  Nonsurgical recommended offloading with silicone pads which I dispensed.  We also discussed surgery if this does not improve.  He will return as needed if it does not improve.  Lesion was debrided as a courtesy today. Return if symptoms worsen or fail to improve.

## 2022-03-30 ENCOUNTER — Encounter: Payer: Self-pay | Admitting: *Deleted
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
## 2022-03-31 ENCOUNTER — Encounter: Payer: Self-pay | Admitting: Cardiovascular Disease

## 2022-03-31 ENCOUNTER — Ambulatory Visit: Payer: Medicare PPO | Admitting: Cardiovascular Disease

## 2022-03-31 VITALS — BP 120/80 | HR 67 | Ht 74.0 in | Wt 196.4 lb

## 2022-03-31 DIAGNOSIS — I10 Essential (primary) hypertension: Secondary | ICD-10-CM

## 2022-03-31 DIAGNOSIS — I700 Atherosclerosis of aorta: Secondary | ICD-10-CM

## 2022-03-31 DIAGNOSIS — I25118 Atherosclerotic heart disease of native coronary artery with other forms of angina pectoris: Secondary | ICD-10-CM | POA: Diagnosis not present

## 2022-03-31 DIAGNOSIS — E782 Mixed hyperlipidemia: Secondary | ICD-10-CM | POA: Diagnosis not present

## 2022-03-31 DIAGNOSIS — R0602 Shortness of breath: Secondary | ICD-10-CM

## 2022-03-31 MED ORDER — EZETIMIBE 10 MG PO TABS: 10.0000 mg | ORAL_TABLET | Freq: Every day | ORAL | 3 refills | Status: DC

## 2022-03-31 MED ORDER — ROSUVASTATIN CALCIUM 40 MG PO TABS: 40.0000 mg | ORAL_TABLET | Freq: Every day | ORAL | 3 refills | Status: DC

## 2022-03-31 MED ORDER — METOPROLOL SUCCINATE ER 25 MG PO TB24: 25.0000 mg | ORAL_TABLET | Freq: Every day | ORAL | 3 refills | Status: DC

## 2022-03-31 NOTE — Patient Instructions (Signed)
Medication Instructions:  No changes  If you need a refill on your cardiac medications before your next appointment, please call your pharmacy.   Lab work: No new labs needed  Testing/Procedures: No new testing needed  Follow-Up: At [facility], you and your health needs are our priority.  As part of our continuing mission to provide you with exceptional heart care, we have created designated Provider Care Teams.  These Care Teams include your primary Cardiologist (physician) and Advanced Practice Providers (APPs -  Physician Assistants and Nurse Practitioners) who all work together to provide you with the care you need, when you need it.  You will need a follow up appointment in 12 months  Providers on your designated Care Team:   Christopher Berge, NP Ryan Dunn, PA-C Cadence Furth, PA-C  COVID-19 Vaccine Information can be found at: [email or web address] For questions related to vaccine distribution or appointments, please email [email or web address] or call [phone].   

## 2022-07-22 ENCOUNTER — Ambulatory Visit: Payer: Medicare PPO | Admitting: Podiatry

## 2022-07-22 DIAGNOSIS — M2042 Other hammer toe(s) (acquired), left foot: Secondary | ICD-10-CM | POA: Diagnosis not present

## 2022-07-22 NOTE — Progress Notes (Signed)
  Subjective:  Patient ID: [name], male    DOB: 03-09-1961,  MRN: [id number]  Chief Complaint  Patient presents with   Hammer Toe    left foot pain between 4th and 5th toes is back    61 y.o. male presents with the above complaint. History confirmed with patient.  He returns for follow-up but still very painful at all in the fifth toe  Objective:  Physical Exam: warm, good capillary refill, no trophic changes or ulcerative lesions, normal DP and PT pulses, normal sensory exam, and distal fifth toe interdigital corn, there is a adductovarus rotation of the toe noted.   Radiographs: Multiple views x-ray of the left foot:  Hammertoe deformities noted in the left foot with adductovarus rotation of the fifth toe Assessment:   1. Hammer toe of left foot      Plan:  Patient was evaluated and treated and all questions answered.  We again discussed the presence of the corn and how the hammertoe deformities relate to this and contribute to the deformity and pain.  We discussed further treatment of this including surgical treatment.  He feels that he has exhausted all nonsurgical treatment at this point and would like to proceed with surgery.  We discussed the risk benefits and potential complications of the surgery including but limited to  pain, swelling, infection, scar, numbness which may be temporary or permanent, chronic pain, stiffness, nerve pain or damage, wound healing problems.  He understands and wishes to proceed.  Further silicone pads were dispensed today.  Surgically we discussed arthroplasty of the fifth toe with derotational skin plasty as well as resection of the medial portion of the distal phalanx and possible excision of the corn.  Surgery be scheduled at a mutually agreeable date we discussed the postoperative weightbearing protocol.  He smokes a cigar most days and I discussed with him that would be important to stop this after surgery to reduce his risk of wound  complication or healing or infection issues   Surgical plan:  Procedure: -Left fifth toe arthroplasty, derotational skin plasty, distal phalanx ostectomy and possible corn excision  Location: -[facility]  Anesthesia plan: -IV sedation with local  Postoperative pain plan: - Tylenol 1000 mg every 6 hours, ibuprofen 600 mg every 6 hours, gabapentin 300 mg every 8 hours x5 days, oxycodone 5 mg 1-2 tabs every 6 hours only as needed  DVT prophylaxis: -None required  WB Restrictions / DME needs: -WBAT in surgical shoe which was dispensed today   No follow-ups on file.

## 2022-07-23 ENCOUNTER — Telehealth: Payer: Self-pay | Admitting: Podiatry

## 2022-07-23 NOTE — Telephone Encounter (Signed)
DOS: 08/17/2022  [employer] Effective 09/08/2019  Hammertoe Repair 5th Lt [phone]) Excision Bone Spur/Corn 5th Lt ([id number])  DX: M20.42 and L84  Deductible: $0 Out-of-pocket: $4,000 with $3,660 remaining CoInsurance: 0%  Prior authorization is required for CPT code 562 [id number] [id number] per [employer] website but is not required for CPT code 28124.  Authorization #: [id number] Tracking #: [id number] Authorization Valid: 08/17/2022 - 11/15/2022

## 2022-08-12 ENCOUNTER — Telehealth: Payer: Self-pay

## 2022-08-12 NOTE — Telephone Encounter (Signed)
[name] called to reschedule his surgery with Dr. Sherryle Lis on 08/17/2022. He stated his dad is 55 and he has a lot of appointments coming up and he needs to take him to these appointments. I have him rescheduled to 10/23/2022. Notified Dr. Sherryle Lis and Caren Griffins with [facility]

## 2022-08-26 ENCOUNTER — Encounter: Payer: Medicare PPO | Admitting: Podiatry

## 2022-09-09 ENCOUNTER — Encounter: Payer: Medicare PPO | Admitting: Podiatry

## 2022-09-23 ENCOUNTER — Telehealth: Payer: Self-pay | Admitting: Podiatry

## 2022-09-23 NOTE — Telephone Encounter (Signed)
DOS: 11/01/2022  [employer] Medicare Effective 09/08/2019  Hammertoe Repair 5th Lt ([id number]) Exc. Bone Spur/Corn 5th Lt [phone])  Deductible: $0 Out-of-Pocket: $4,000 with $0 met CoInsurance: 0%  Authorization #: [id number] Tracking #: [id number] Authorization Valid: 01-Nov-2022 Only  The following codes do not require a pre-authorization All services are subject to members benefits, exclusions, limitations and other applicable conditions. Created on 09/23/2022  Service info [phone] Partial excision (craterization, saucerization, sequestrectomy, or diaphysectomy) bone (eg, osteomyelitis or bossing); phalanx of toe This document contains confidential information and is protected by the [employer] and Accountability Act (HIPAA), the [place] for Economic and [place] (HITECH) and a number of other federal and state privacy laws. This document and its contents may only be accessed, used or disclosed by duly authorized individuals in the course of the subject's treatment, claims processing or as otherwise required or permitted by law. Any other access, use or disclosure is strictly prohibited and may result in civil or criminal penalties.

## 2022-09-28 ENCOUNTER — Encounter: Payer: Medicare PPO | Admitting: Podiatry

## 2022-10-23 ENCOUNTER — Other Ambulatory Visit: Payer: Self-pay | Admitting: Podiatry

## 2022-10-23 ENCOUNTER — Telehealth: Payer: Self-pay

## 2022-10-23 NOTE — Telephone Encounter (Signed)
[name] was scheduled for surgery with Dr. Sherryle Lis on 10/23/2022 and he no showed.    Note from Dr. Sherryle Lis "He didn't show today, surgery center said he didn't answer any calls from them all week, we called 2x this AM. I'd say lets not reshedule. Can cancel his f/u appts "

## 2022-11-02 ENCOUNTER — Encounter: Payer: Medicare PPO | Admitting: Podiatry

## 2022-11-16 ENCOUNTER — Encounter: Payer: Medicare PPO | Admitting: Podiatry

## 2022-12-07 ENCOUNTER — Encounter: Payer: Medicare PPO | Admitting: Podiatry
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
## 2023-12-20 ENCOUNTER — Encounter: Payer: Self-pay | Admitting: Cardiovascular Disease

## 2023-12-20 ENCOUNTER — Ambulatory Visit: Payer: Medicare PPO | Attending: Cardiovascular Disease | Admitting: Cardiovascular Disease

## 2023-12-20 VITALS — BP 142/82 | HR 47 | Ht 74.0 in | Wt 205.2 lb

## 2023-12-20 DIAGNOSIS — I700 Atherosclerosis of aorta: Secondary | ICD-10-CM | POA: Diagnosis not present

## 2023-12-20 DIAGNOSIS — E782 Mixed hyperlipidemia: Secondary | ICD-10-CM | POA: Diagnosis not present

## 2023-12-20 DIAGNOSIS — I25118 Atherosclerotic heart disease of native coronary artery with other forms of angina pectoris: Secondary | ICD-10-CM | POA: Diagnosis not present

## 2023-12-20 DIAGNOSIS — I10 Essential (primary) hypertension: Secondary | ICD-10-CM

## 2023-12-20 DIAGNOSIS — R0602 Shortness of breath: Secondary | ICD-10-CM

## 2023-12-20 MED ORDER — METOPROLOL SUCCINATE ER 25 MG PO TB24: 12.5000 mg | ORAL_TABLET | Freq: Every day | ORAL | 3 refills | Status: AC

## 2023-12-20 MED ORDER — ROSUVASTATIN CALCIUM 40 MG PO TABS: 40.0000 mg | ORAL_TABLET | Freq: Every day | ORAL | 3 refills | Status: AC

## 2023-12-20 MED ORDER — NITROGLYCERIN 0.4 MG SL SUBL: 0.4000 mg | SUBLINGUAL_TABLET | SUBLINGUAL | 3 refills | Status: AC | PRN

## 2023-12-20 MED ORDER — EZETIMIBE 10 MG PO TABS: 10.0000 mg | ORAL_TABLET | Freq: Every day | ORAL | 3 refills | Status: AC

## 2023-12-20 NOTE — Patient Instructions (Signed)
 Medication Instructions:  Please cut the metoprolol in 1/2 daily (12.5 daily)  Please restart crestor daily  If you need a refill on your cardiac medications before your next appointment, please call your pharmacy.   Lab work: No new labs needed  Testing/Procedures: No new testing needed  Follow-Up: At [facility], you and your health needs are our priority.  As part of our continuing mission to provide you with exceptional heart care, we have created designated Provider Care Teams.  These Care Teams include your primary Cardiologist (physician) and Advanced Practice Providers (APPs -  Physician Assistants and Nurse Practitioners) who all work together to provide you with the care you need, when you need it.  You will need a follow up appointment in 12 months  Providers on your designated Care Team:   Laneta Pintos, NP Varney Gentleman, PA-C Cadence Gennaro Khat, [place]  COVID-19 Vaccine Information can be found at: [email or web address] For questions related to vaccine distribution or [email or web address] please email [email or web address] or call [phone].
